# Patient Record
Sex: Male | Born: 1945 | Race: White | Hispanic: No | Marital: Married | Smoking: Never smoker
Health system: Southern US, Community
[De-identification: ages and names within clinical notes are randomized; demographics above are authoritative.]

## PROBLEM LIST (undated history)

## (undated) DIAGNOSIS — E291 Testicular hypofunction: Secondary | ICD-10-CM

## (undated) DIAGNOSIS — N529 Male erectile dysfunction, unspecified: Secondary | ICD-10-CM

## (undated) DIAGNOSIS — N35914 Unspecified anterior urethral stricture, male: Secondary | ICD-10-CM

## (undated) DIAGNOSIS — M47816 Spondylosis without myelopathy or radiculopathy, lumbar region: Secondary | ICD-10-CM

## (undated) DIAGNOSIS — R519 Headache, unspecified: Secondary | ICD-10-CM

## (undated) DIAGNOSIS — Z7901 Long term (current) use of anticoagulants: Secondary | ICD-10-CM

## (undated) DIAGNOSIS — F5104 Psychophysiologic insomnia: Secondary | ICD-10-CM

## (undated) DIAGNOSIS — E559 Vitamin D deficiency, unspecified: Secondary | ICD-10-CM

## (undated) DIAGNOSIS — Z951 Presence of aortocoronary bypass graft: Secondary | ICD-10-CM

## (undated) DIAGNOSIS — K219 Gastro-esophageal reflux disease without esophagitis: Secondary | ICD-10-CM

## (undated) DIAGNOSIS — J309 Allergic rhinitis, unspecified: Secondary | ICD-10-CM

## (undated) DIAGNOSIS — E611 Iron deficiency: Secondary | ICD-10-CM

## (undated) DIAGNOSIS — I341 Nonrheumatic mitral (valve) prolapse: Secondary | ICD-10-CM

## (undated) DIAGNOSIS — M5136 Other intervertebral disc degeneration, lumbar region: Secondary | ICD-10-CM

## (undated) DIAGNOSIS — M159 Polyosteoarthritis, unspecified: Secondary | ICD-10-CM

## (undated) DIAGNOSIS — M1990 Unspecified osteoarthritis, unspecified site: Secondary | ICD-10-CM

## (undated) DIAGNOSIS — I6523 Occlusion and stenosis of bilateral carotid arteries: Secondary | ICD-10-CM

## (undated) DIAGNOSIS — E782 Mixed hyperlipidemia: Secondary | ICD-10-CM

## (undated) DIAGNOSIS — G609 Hereditary and idiopathic neuropathy, unspecified: Secondary | ICD-10-CM

## (undated) DIAGNOSIS — N400 Enlarged prostate without lower urinary tract symptoms: Secondary | ICD-10-CM

## (undated) DIAGNOSIS — M545 Low back pain, unspecified: Secondary | ICD-10-CM

## (undated) DIAGNOSIS — M461 Sacroiliitis, not elsewhere classified: Secondary | ICD-10-CM

## (undated) DIAGNOSIS — I10 Essential (primary) hypertension: Secondary | ICD-10-CM

## (undated) DIAGNOSIS — G453 Amaurosis fugax: Secondary | ICD-10-CM

## (undated) DIAGNOSIS — I2510 Atherosclerotic heart disease of native coronary artery without angina pectoris: Secondary | ICD-10-CM

## (undated) DIAGNOSIS — F419 Anxiety disorder, unspecified: Secondary | ICD-10-CM

## (undated) DIAGNOSIS — Z952 Presence of prosthetic heart valve: Secondary | ICD-10-CM

## (undated) HISTORY — DX: Psychophysiologic insomnia: F51.04

## (undated) HISTORY — DX: Atherosclerotic heart disease of native coronary artery without angina pectoris: I25.10

## (undated) HISTORY — DX: Iron deficiency: E61.1

## (undated) HISTORY — DX: Long term (current) use of anticoagulants: Z79.01

## (undated) HISTORY — DX: Testicular hypofunction: E29.1

## (undated) HISTORY — DX: Mixed hyperlipidemia: E78.2

## (undated) HISTORY — DX: Low back pain, unspecified: M54.50

## (undated) HISTORY — PX: COLONOSCOPY: SHX174

## (undated) HISTORY — PX: CATARACT EXTRACTION: SUR2

## (undated) HISTORY — DX: Anxiety disorder, unspecified: F41.9

## (undated) HISTORY — DX: Unspecified anterior urethral stricture, male: N35.914

## (undated) HISTORY — PX: NASAL SEPTUM SURGERY: SHX37

## (undated) HISTORY — DX: Headache, unspecified: R51.9

## (undated) HISTORY — DX: Unspecified osteoarthritis, unspecified site: M19.90

## (undated) HISTORY — DX: Male erectile dysfunction, unspecified: N52.9

## (undated) HISTORY — DX: Gastro-esophageal reflux disease without esophagitis: K21.9

## (undated) HISTORY — DX: Polyosteoarthritis, unspecified: M15.9

## (undated) HISTORY — DX: Hereditary and idiopathic neuropathy, unspecified: G60.9

## (undated) HISTORY — PX: OTHER SURGICAL HISTORY: SHX169

## (undated) HISTORY — DX: Amaurosis fugax: G45.3

## (undated) HISTORY — DX: Allergic rhinitis, unspecified: J30.9

## (undated) HISTORY — DX: Occlusion and stenosis of bilateral carotid arteries: I65.23

## (undated) HISTORY — DX: Other intervertebral disc degeneration, lumbar region: M51.36

## (undated) HISTORY — DX: Spondylosis without myelopathy or radiculopathy, lumbar region: M47.816

## (undated) HISTORY — PX: BUNIONECTOMY: SHX129

## (undated) HISTORY — PX: CORONARY ARTERY BYPASS GRAFT: SHX141

## (undated) HISTORY — DX: Essential (primary) hypertension: I10

## (undated) HISTORY — DX: Sacroiliitis, not elsewhere classified: M46.1

## (undated) HISTORY — DX: Benign prostatic hyperplasia without lower urinary tract symptoms: N40.0

## (undated) HISTORY — DX: Vitamin D deficiency, unspecified: E55.9

## (undated) HISTORY — PX: HERNIA REPAIR: SHX51

---

## 2019-02-15 ENCOUNTER — Encounter: Payer: Self-pay | Admitting: *Deleted

## 2019-10-16 ENCOUNTER — Other Ambulatory Visit: Payer: Self-pay | Admitting: Dermatology

## 2020-03-07 ENCOUNTER — Emergency Department (HOSPITAL_COMMUNITY): Payer: Medicare Other

## 2020-03-07 ENCOUNTER — Encounter (HOSPITAL_COMMUNITY): Payer: Self-pay

## 2020-03-07 ENCOUNTER — Observation Stay (HOSPITAL_COMMUNITY)
Admission: EM | Admit: 2020-03-07 | Discharge: 2020-03-10 | Disposition: A | Discharge status: Home / Self Care | Payer: Medicare Other | Attending: Family Medicine | Admitting: Family Medicine

## 2020-03-07 DIAGNOSIS — Z952 Presence of prosthetic heart valve: Secondary | ICD-10-CM | POA: Insufficient documentation

## 2020-03-07 DIAGNOSIS — G453 Amaurosis fugax: Secondary | ICD-10-CM | POA: Diagnosis not present

## 2020-03-07 DIAGNOSIS — I2510 Atherosclerotic heart disease of native coronary artery without angina pectoris: Secondary | ICD-10-CM | POA: Insufficient documentation

## 2020-03-07 DIAGNOSIS — Z7982 Long term (current) use of aspirin: Secondary | ICD-10-CM | POA: Insufficient documentation

## 2020-03-07 DIAGNOSIS — R911 Solitary pulmonary nodule: Secondary | ICD-10-CM | POA: Diagnosis not present

## 2020-03-07 DIAGNOSIS — G459 Transient cerebral ischemic attack, unspecified: Secondary | ICD-10-CM | POA: Diagnosis not present

## 2020-03-07 DIAGNOSIS — I10 Essential (primary) hypertension: Secondary | ICD-10-CM | POA: Insufficient documentation

## 2020-03-07 DIAGNOSIS — Z79899 Other long term (current) drug therapy: Secondary | ICD-10-CM | POA: Diagnosis not present

## 2020-03-07 DIAGNOSIS — I6782 Cerebral ischemia: Secondary | ICD-10-CM | POA: Diagnosis not present

## 2020-03-07 DIAGNOSIS — Z20822 Contact with and (suspected) exposure to covid-19: Secondary | ICD-10-CM | POA: Insufficient documentation

## 2020-03-07 DIAGNOSIS — E785 Hyperlipidemia, unspecified: Secondary | ICD-10-CM | POA: Diagnosis not present

## 2020-03-07 DIAGNOSIS — Z8249 Family history of ischemic heart disease and other diseases of the circulatory system: Secondary | ICD-10-CM | POA: Insufficient documentation

## 2020-03-07 DIAGNOSIS — H53132 Sudden visual loss, left eye: Secondary | ICD-10-CM | POA: Diagnosis present

## 2020-03-07 DIAGNOSIS — N400 Enlarged prostate without lower urinary tract symptoms: Secondary | ICD-10-CM | POA: Insufficient documentation

## 2020-03-07 DIAGNOSIS — Z951 Presence of aortocoronary bypass graft: Secondary | ICD-10-CM | POA: Diagnosis not present

## 2020-03-07 DIAGNOSIS — K219 Gastro-esophageal reflux disease without esophagitis: Secondary | ICD-10-CM | POA: Diagnosis not present

## 2020-03-07 DIAGNOSIS — Z7901 Long term (current) use of anticoagulants: Secondary | ICD-10-CM | POA: Diagnosis not present

## 2020-03-07 HISTORY — DX: Nonrheumatic mitral (valve) prolapse: I34.1

## 2020-03-07 HISTORY — DX: Presence of aortocoronary bypass graft: Z95.1

## 2020-03-07 HISTORY — DX: Presence of prosthetic heart valve: Z95.2

## 2020-03-07 LAB — DIFFERENTIAL
Abs Immature Granulocytes: 0.01 10*3/uL (ref 0.00–0.07)
Basophils Absolute: 0 10*3/uL (ref 0.0–0.1)
Basophils Relative: 1 %
Eosinophils Absolute: 0.1 10*3/uL (ref 0.0–0.5)
Eosinophils Relative: 1 %
Immature Granulocytes: 0 %
Lymphocytes Relative: 18 %
Lymphs Abs: 1.3 10*3/uL (ref 0.7–4.0)
Monocytes Absolute: 0.4 10*3/uL (ref 0.1–1.0)
Monocytes Relative: 5 %
Neutro Abs: 5.1 10*3/uL (ref 1.7–7.7)
Neutrophils Relative %: 75 %

## 2020-03-07 LAB — PROTIME-INR
INR: 2.5 — ABNORMAL HIGH (ref 0.8–1.2)
Prothrombin Time: 26.5 seconds — ABNORMAL HIGH (ref 11.4–15.2)

## 2020-03-07 LAB — COMPREHENSIVE METABOLIC PANEL
ALT: 12 U/L (ref 0–44)
AST: 21 U/L (ref 15–41)
Albumin: 4 g/dL (ref 3.5–5.0)
Alkaline Phosphatase: 41 U/L (ref 38–126)
Anion gap: 9 (ref 5–15)
BUN: 11 mg/dL (ref 8–23)
CO2: 25 mmol/L (ref 22–32)
Calcium: 8.9 mg/dL (ref 8.9–10.3)
Chloride: 102 mmol/L (ref 98–111)
Creatinine, Ser: 1.18 mg/dL (ref 0.61–1.24)
GFR calc Af Amer: >60 mL/min (ref >60)
GFR calc non Af Amer: >60 mL/min (ref >60)
Glucose, Bld: 167 mg/dL — ABNORMAL HIGH (ref 70–99)
Potassium: 4.1 mmol/L (ref 3.5–5.1)
Sodium: 136 mmol/L (ref 135–145)
Total Bilirubin: 0.8 mg/dL (ref 0.3–1.2)
Total Protein: 6.3 g/dL — ABNORMAL LOW (ref 6.5–8.1)

## 2020-03-07 LAB — APTT: aPTT: 43 seconds — ABNORMAL HIGH (ref 24–36)

## 2020-03-07 LAB — CBC
HCT: 48 % (ref 39.0–52.0)
Hemoglobin: 15.2 g/dL (ref 13.0–17.0)
MCH: 28.2 pg (ref 26.0–34.0)
MCHC: 31.7 g/dL (ref 30.0–36.0)
MCV: 89.1 fL (ref 80.0–100.0)
Platelets: 176 10*3/uL (ref 150–400)
RBC: 5.39 MIL/uL (ref 4.22–5.81)
RDW: 15 % (ref 11.5–15.5)
WBC: 6.8 10*3/uL (ref 4.0–10.5)
nRBC: 0 % (ref 0.0–0.2)

## 2020-03-07 LAB — I-STAT CHEM 8, ED
BUN: 14 mg/dL (ref 8–23)
Calcium, Ion: 1.16 mmol/L (ref 1.15–1.40)
Chloride: 100 mmol/L (ref 98–111)
Creatinine, Ser: 1.1 mg/dL (ref 0.61–1.24)
Glucose, Bld: 163 mg/dL — ABNORMAL HIGH (ref 70–99)
HCT: 46 % (ref 39.0–52.0)
Hemoglobin: 15.6 g/dL (ref 13.0–17.0)
Potassium: 4.1 mmol/L (ref 3.5–5.1)
Sodium: 139 mmol/L (ref 135–145)
TCO2: 28 mmol/L (ref 22–32)

## 2020-03-07 MED ORDER — SODIUM CHLORIDE 0.9% FLUSH: 3.0000 mL | Freq: Once | INTRAVENOUS | Status: DC

## 2020-03-07 NOTE — ED Triage Notes (Addendum)
Pt arrives to ED w/ c/o intermittent partial vision loss in L eye lasting 1-2 mins that happened this morning at 0700. Vision has since returned to normal. Pt denies unilateral weakness, slurred speech, sensation changes. Pt AOx4, neuro intact in triage. Pt sent from eye MD to r/o stroke.

## 2020-03-08 ENCOUNTER — Emergency Department (HOSPITAL_BASED_OUTPATIENT_CLINIC_OR_DEPARTMENT_OTHER): Payer: Medicare Other

## 2020-03-08 ENCOUNTER — Emergency Department (HOSPITAL_COMMUNITY): Payer: Medicare Other

## 2020-03-08 ENCOUNTER — Encounter (HOSPITAL_COMMUNITY): Payer: Self-pay | Admitting: Emergency Medicine

## 2020-03-08 DIAGNOSIS — G453 Amaurosis fugax: Secondary | ICD-10-CM | POA: Diagnosis not present

## 2020-03-08 DIAGNOSIS — G459 Transient cerebral ischemic attack, unspecified: Secondary | ICD-10-CM

## 2020-03-08 LAB — ECHOCARDIOGRAM COMPLETE
AR max vel: 2.49 cm2
AV Area VTI: 2.42 cm2
AV Area mean vel: 2.38 cm2
AV Mean grad: 3 mmHg
AV Peak grad: 5.9 mmHg
Ao pk vel: 1.21 m/s
Area-P 1/2: 2.62 cm2
P 1/2 time: 560 msec
S' Lateral: 3.1 cm

## 2020-03-08 LAB — HEMOGLOBIN A1C
Hgb A1c MFr Bld: 5.5 % (ref 4.8–5.6)
Mean Plasma Glucose: 111.15 mg/dL

## 2020-03-08 LAB — LIPID PANEL
Cholesterol: 129 mg/dL (ref 0–200)
HDL: 44 mg/dL (ref >40)
LDL Cholesterol: 77 mg/dL (ref 0–99)
Total CHOL/HDL Ratio: 2.9 RATIO
Triglycerides: 42 mg/dL (ref <150)
VLDL: 8 mg/dL (ref 0–40)

## 2020-03-08 LAB — PROTIME-INR
INR: 2.1 — ABNORMAL HIGH (ref 0.8–1.2)
Prothrombin Time: 22.8 seconds — ABNORMAL HIGH (ref 11.4–15.2)

## 2020-03-08 LAB — TSH: TSH: 0.442 u[IU]/mL (ref 0.350–4.500)

## 2020-03-08 LAB — SARS CORONAVIRUS 2 BY RT PCR (HOSPITAL ORDER, PERFORMED IN ~~LOC~~ HOSPITAL LAB): SARS Coronavirus 2: NEGATIVE

## 2020-03-08 LAB — SEDIMENTATION RATE: Sed Rate: 0 mm/hr (ref 0–16)

## 2020-03-08 LAB — C-REACTIVE PROTEIN: CRP: <0.5 mg/dL (ref <1.0)

## 2020-03-08 MED ORDER — ALFUZOSIN HCL ER 10 MG PO TB24
10.0000 mg | ORAL_TABLET | Freq: Every day | ORAL | Status: DC
  Administered 2020-03-08 – 2020-03-09 (×2): 10 mg via ORAL
  Filled 2020-03-08 (×3): qty 1

## 2020-03-08 MED ORDER — AMLODIPINE BESYLATE 5 MG PO TABS: 2.5000 mg | ORAL_TABLET | Freq: Every day | ORAL | Status: DC

## 2020-03-08 MED ORDER — IOHEXOL 350 MG/ML SOLN
80.0000 mL | Freq: Once | INTRAVENOUS | Status: AC | PRN
  Administered 2020-03-08: 80 mL via INTRAVENOUS

## 2020-03-08 MED ORDER — WARFARIN SODIUM 7.5 MG PO TABS
7.5000 mg | ORAL_TABLET | Freq: Once | ORAL | Status: AC
  Administered 2020-03-08: 7.5 mg via ORAL
  Filled 2020-03-08: qty 1

## 2020-03-08 MED ORDER — SIMVASTATIN 20 MG PO TABS
40.0000 mg | ORAL_TABLET | Freq: Every day | ORAL | Status: DC
  Administered 2020-03-08 – 2020-03-09 (×2): 40 mg via ORAL
  Filled 2020-03-08 (×2): qty 2

## 2020-03-08 MED ORDER — AMLODIPINE BESYLATE 2.5 MG PO TABS
2.5000 mg | ORAL_TABLET | Freq: Every day | ORAL | Status: DC
  Administered 2020-03-09: 2.5 mg via ORAL
  Filled 2020-03-08: qty 1

## 2020-03-08 MED ORDER — PANTOPRAZOLE SODIUM 40 MG PO TBEC
40.0000 mg | DELAYED_RELEASE_TABLET | Freq: Every day | ORAL | Status: DC
  Administered 2020-03-08 – 2020-03-10 (×3): 40 mg via ORAL
  Filled 2020-03-08 (×3): qty 1

## 2020-03-08 MED ORDER — ACETAMINOPHEN 325 MG PO TABS
650.0000 mg | ORAL_TABLET | Freq: Four times a day (QID) | ORAL | Status: DC | PRN
  Administered 2020-03-09 – 2020-03-10 (×3): 650 mg via ORAL
  Filled 2020-03-08 (×3): qty 2

## 2020-03-08 MED ORDER — ACETAMINOPHEN 650 MG RE SUPP: 650.0000 mg | Freq: Four times a day (QID) | RECTAL | Status: DC | PRN

## 2020-03-08 MED ORDER — NITROGLYCERIN 0.4 MG SL SUBL: 0.4000 mg | SUBLINGUAL_TABLET | SUBLINGUAL | Status: DC | PRN

## 2020-03-08 MED ORDER — AMLODIPINE BESYLATE 5 MG PO TABS
5.0000 mg | ORAL_TABLET | Freq: Once | ORAL | Status: AC
  Administered 2020-03-08: 5 mg via ORAL
  Filled 2020-03-08: qty 1

## 2020-03-08 MED ORDER — ASPIRIN EC 81 MG PO TBEC
81.0000 mg | DELAYED_RELEASE_TABLET | Freq: Every day | ORAL | Status: DC
  Administered 2020-03-08 – 2020-03-09 (×2): 81 mg via ORAL
  Filled 2020-03-08 (×2): qty 1

## 2020-03-08 MED ORDER — WARFARIN - PHARMACIST DOSING INPATIENT: Freq: Every day | Status: DC

## 2020-03-08 MED ORDER — GABAPENTIN 100 MG PO CAPS
100.0000 mg | ORAL_CAPSULE | Freq: Two times a day (BID) | ORAL | Status: DC
  Administered 2020-03-08 – 2020-03-10 (×5): 100 mg via ORAL
  Filled 2020-03-08 (×5): qty 1

## 2020-03-08 NOTE — ED Notes (Signed)
Echo at bedside

## 2020-03-08 NOTE — ED Notes (Signed)
Patient transported to CT 

## 2020-03-08 NOTE — Progress Notes (Signed)
ANTICOAGULATION CONSULT NOTE - Initial Consult  Pharmacy Consult for warfarin Indication: Mech MVR  No Known Allergies  Patient Measurements:   Pending at this time    Vital Signs: Temp: 98.1 F (36.7 C) (08/13 0618) Temp Source: Oral (08/13 0618) BP: 169/85 (08/13 0826) Pulse Rate: 88 (08/13 0623)  Labs: Recent Labs    03/07/20 1549 03/07/20 1609  HGB 15.2 15.6  HCT 48.0 46.0  PLT 176  --   APTT 43*  --   LABPROT 26.5*  --   INR 2.5*  --   CREATININE 1.18 1.10    CrCl cannot be calculated (Unknown ideal weight.).   Medical History: Past Medical History:  Diagnosis Date  . Arthritis   . Hx of CABG   . Hypertension   . MVP (mitral valve prolapse)   . S/P MVR (mitral valve replacement)    Assessment: 74 yo M on warfarin for hx mechanical mitral valve replacement. INR 2.5 on admission. Admitted with possible TIA with transient vision loss. Of note, had conjunctival hemorrhage in the right eye on 03/05/20 during valsalva (blowing nose). No other active bleeding.    INR down to 2.1, did not give warfarin dose 8/12 while in ED due to pending stroke/TIA work up. Will give higher dose today to prevent further INR drop.  PTA Warfarin dose: 2mg  on Wed/Sat and 4mg  all other days (confirmed with patient 8/13)   Goal of Therapy:  INR 2.5- 3.5 Monitor platelets by anticoagulation protocol: Yes   Plan:  Warfarin 7.5mg  x1  Monitor daily INR, CBC/plt Monitor for signs/symptoms of bleeding    Benetta Spar, PharmD, BCPS, BCCP Clinical Pharmacist  Please check AMION for all [place] phone numbers After 10:00 PM, call [place]

## 2020-03-08 NOTE — ED Provider Notes (Signed)
[facility] EMERGENCY DEPARTMENT Provider Note   CSN: [id number] Arrival date & time: 03/07/20  1518     History Chief Complaint  Patient presents with  . Loss of Vision    Tyler Case is a 74 y.o. male.  HPI 74 yo male with lkn 24 hours began having left eye vision change felt like a shade cming down.  Dr. Posey Pronto saw in office and did study with iv dye.  Dr. Posey Pronto told patient that he had stroke of eye and to come the ED.  Left eye vision resolved after 1-2 minutes and now without symptoms.  Denies speaking, walking, no loss of strength or sensation.  Angiogram done in office-no plaque, ho arterial occlusion in right,  Ho amaurosis right eye.     PMD  Dr. Macarthur Critchley in [place] Dr. Posey Pronto for oph Cardiology- Dr. Lacey Jensen  No  History of stroke Ho cabg and mitral valve replacement Patient on coumadin but missed dose last night due to being here. Inr goal 2.5-3.5- last 2.6 2 weeks ago- no dosage change Covid vaccine- moderna second shot mid May, no sxs no known exposure Past Medical History:  Diagnosis Date  . Arthritis   . Hypertension     There are no problems to display for this patient.   History reviewed. No pertinent surgical history.     No family history on file.  Social History   Tobacco Use  . Smoking status: Never Smoker  . Smokeless tobacco: Never Used  Vaping Use  . Vaping Use: Never used  Substance Use Topics  . Alcohol use: Yes    Alcohol/week: 7.0 standard drinks    Types: 7 Glasses of wine per week  . Drug use: Never    Home Medications Prior to Admission medications   Medication Sig Start Date End Date Taking? Authorizing Provider  esomeprazole (NEXIUM) 40 MG capsule Take 40 mg by mouth daily at 12 noon.    [provider]  imiquimod (ALDARA) 5 % cream APPLY  CREAM EXTERNALLY TO AFFECTED AREA ON MONDAY, [facility] AND FRIDAY FOR 35-70 APPLICATIONS 1/77/93   Lavonna Monarch, MD  simvastatin (ZOCOR) 40 MG  tablet Take 40 mg by mouth daily.    [provider]    Allergies    Patient has no known allergies.  Review of Systems   Review of Systems  All other systems reviewed and are negative.   Physical Exam Updated Vital Signs BP (!) 140/98   Pulse 88   Temp 98.1 F (36.7 C) (Oral)   Resp 19   SpO2 98%   Physical Exam Vitals and nursing note reviewed.  Constitutional:      Appearance: Normal appearance.  HENT:     Head: Normocephalic and atraumatic.     Right Ear: External ear normal.     Left Ear: External ear normal.     Nose: Nose normal.     Mouth/Throat:     Mouth: Mucous membranes are moist.  Eyes:     Extraocular Movements: Extraocular movements intact.     Pupils: Pupils are equal, round, and reactive to light.  Cardiovascular:     Rate and Rhythm: Normal rate and regular rhythm.     Pulses: Normal pulses.     Heart sounds: Normal heart sounds.  Pulmonary:     Effort: Pulmonary effort is normal.  Abdominal:     General: Abdomen is flat.     Palpations: Abdomen is soft.  Musculoskeletal:  General: Normal range of motion.     Cervical back: Normal range of motion.  Skin:    General: Skin is warm.     Capillary Refill: Capillary refill takes less than 2 seconds.  Neurological:     General: No focal deficit present.     Mental Status: He is alert and oriented to person, place, and time. Mental status is at baseline.     Cranial Nerves: No cranial nerve deficit.     Sensory: No sensory deficit.     Motor: No weakness.     Coordination: Coordination normal.     Gait: Gait normal.     Deep Tendon Reflexes: Reflexes normal.  Psychiatric:        Mood and Affect: Mood normal.        Behavior: Behavior normal.     ED Results / Procedures / Treatments   Labs (all labs ordered are listed, but only abnormal results are displayed) Labs Reviewed  PROTIME-INR - Abnormal; Notable for the following components:      Result Value   Prothrombin Time  26.5 (*)    INR 2.5 (*)    All other components within normal limits  APTT - Abnormal; Notable for the following components:   aPTT 43 (*)    All other components within normal limits  COMPREHENSIVE METABOLIC PANEL - Abnormal; Notable for the following components:   Glucose, Bld 167 (*)    Total Protein 6.3 (*)    All other components within normal limits  I-STAT CHEM 8, ED - Abnormal; Notable for the following components:   Glucose, Bld 163 (*)    All other components within normal limits  CBC  DIFFERENTIAL    EKG None  Radiology CT HEAD WO CONTRAST  Result Date: 03/07/2020 CLINICAL DATA:  Vision loss, binocular. EXAM: CT HEAD WITHOUT CONTRAST TECHNIQUE: Contiguous axial images were obtained from the base of the skull through the vertex without intravenous contrast. COMPARISON:  No pertinent prior exams are available for comparison. FINDINGS: Brain: Cerebral volume is normal for age. Minimal ill-defined hypoattenuation within the cerebral white matter is nonspecific, but consistent with chronic small vessel ischemic disease. A tiny age-indeterminate lacunar infarct is also questioned within the posterior right pons (series 3, image 14). There are tiny age-indeterminate infarcts within the cerebellar hemispheres bilaterally (series 3, images 7, 8, 10) There is no acute intracranial hemorrhage. No demarcated cortical infarct. No extra-axial fluid collection. No evidence of intracranial mass. No midline shift. Vascular: No hyperdense vessel Skull: Normal. Negative for fracture or focal lesion. Sinuses/Orbits: Visualized orbits show no acute finding. Ethmoid sinus mucosal thickening with partial opacification of bilateral ethmoid air cells. Mild maxillary sinus mucosal thickening. No significant mastoid effusion. IMPRESSION: Tiny age-indeterminate infarcts within the cerebellar hemispheres bilaterally. A tiny age-indeterminate lacunar infarct is also questioned within the posterior right pons.  Minimal chronic small vessel ischemic changes within the cerebral white matter. Paranasal sinus disease as described, most notably ethmoidal. Correlate for acute sinusitis. Electronically Signed   By: Kellie Simmering DO   On: 03/07/2020 16:56   MR BRAIN WO CONTRAST  Result Date: 03/07/2020 CLINICAL DATA:  Intermittent partial left vision loss EXAM: MRI HEAD WITHOUT CONTRAST TECHNIQUE: Multiplanar, multiecho pulse sequences of the brain and surrounding structures were obtained without intravenous contrast. COMPARISON:  None. FINDINGS: Brain: There is no acute infarction or intracranial hemorrhage. There is no intracranial mass, mass effect, or edema. There is no hydrocephalus or extra-axial fluid collection. Small chronic cerebellar infarcts.  Patchy T2 hyperintensity in the supratentorial and pontine white matter is nonspecific but may reflect mild chronic microvascular ischemic changes. A few scattered punctate foci of susceptibility cerebral white matter are most compatible with chronic microhemorrhages. Vascular: Major vessel flow voids at the skull base are preserved. Skull and upper cervical spine: Normal marrow signal is preserved. Sinuses/Orbits: Minor mucosal thickening.  Orbits are unremarkable. Other: Sella is unremarkable.  Mastoid air cells are clear. IMPRESSION: No evidence of recent infarction, hemorrhage, or mass. Mild chronic microvascular ischemic changes. Few scattered chronic microhemorrhages. Electronically Signed   By: Macy Mis M.D.   On: 03/07/2020 19:55    Procedures Procedures (including critical care time)  Medications Ordered in ED Medications  sodium chloride flush (NS) 0.9 % injection 3 mL (has no administration in time range)    ED Course  I have reviewed the triage vital signs and the nursing notes.  Pertinent labs & imaging results that were available during my care of the patient were reviewed by me and considered in my medical decision making (see chart for  details). Discussed with Dr. Posey Pronto and he did angiogram of left eye in office- no plaque noted. Patient sent to ED due to history of plaque right eye in June.  Discussed with Dr Curly Shores and she will consult and add carotid studies  Discussed with Dr. Tarry Kos, on call for unassigned-FP team.  She will see and evaluate patient.     MDM Rules/Calculators/A&P                           Final Clinical Impression(s) / ED Diagnoses Final diagnoses:  Amaurosis fugax of left eye    Rx / DC Orders ED Discharge Orders    None       Pattricia Boss, MD 03/08/20 [phone]

## 2020-03-08 NOTE — ED Notes (Signed)
The pt and wife are concerned about the dose of coumadin is too high  Ed pharmacist contacted and he will clerify if we give this dose or not

## 2020-03-08 NOTE — Hospital Course (Addendum)
Tyler Case is a 74 y.o. male presenting with transient monocular vision loss of the left eye. 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 Below is his brief hospital course listed by problem list. Refer to H&P for additional information.  Amaurosis-fugax of Left eye Presented after transient painless monocular vision loss of left eye night piror. Seen by ophthalmologist prior to arrival at ED, had negative angiogram. Instructed to come to the Emergency Department for further work up. CTA head and neck negative in ED. ESA/CRP negative. CBC and CMP within normal limits. Neurology consulted. HbgA1c 5.5, lipid panel WNL, TSH 0.442, RPR non-reactive, HIV non-reactive, Echo unremarkable. Continued on home ASA and simvastatin. INR dosed by pharmacy, goal 3-3.5 for mitral valve replacement and with his TIA at INR 2.5. Subtherapeutic on admission, required Lovenox bridge. INR *** on discharge.  Other medical conditions chronic and stable.   Imaging  CT Angio Head and Neck 8/13 IMPRESSION: No large vessel occlusion or hemodynamically significant stenosis. 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 2 mm right upper lobe nodule. No follow-up needed if patient is low-risk. Non-contrast chest CT can be considered in 12 months if patient is high-risk. This recommendation follows the consensus statement: Guidelines for Management of Incidental Pulmonary Nodules Detected on CT Images: From the Fleischner Society 2017; Radiology 2017; 284:228-243.  CT Head without Contrast 8/12 IMPRESSION: Tiny age-indeterminate infarcts within the cerebellar hemispheres bilaterally.A tiny age-indeterminate lacunar infarct is also questioned within the posterior right pons.Minimal chronic small vessel ischemic changes within  the cerebral white matter. Paranasal sinus disease as described, most notably ethmoidal.Correlate for acute sinusitis.  MR Brain Without Contrast 8/12 IMPRESSION: No evidence of recent infarction, hemorrhage, or mass. Mild chronic microvascular ischemic changes. Few scattered chronic microhemorrhages.  Follow up issues: Follow up with Neurologist outpatient

## 2020-03-08 NOTE — Progress Notes (Signed)
  Echocardiogram 2D Echocardiogram has been performed.  Tyler Case 03/08/2020, 2:37 PM

## 2020-03-08 NOTE — Consult Note (Addendum)
Neurology Consultation Reason for Consult: amneurosis fugax Referring Physician: Dr. Jeanell Sparrow  CC: Transient left eye superior vision loss  History is obtained from: Patient  HPI: Tyler Case is a 74 y.o. male presented to the hospital secondary to having sudden onset of superior vision out of the left eye which lasted for about 1.5 minutes and resolved.  Patient sees Dr. Posey Pronto ophthalmology to whom he did call and explain the symptoms.  At that point Dr. Posey Pronto felt as though he should go to the ED to be observed as this could be an ameurosis fugax.  Currently patient has no symptoms.  He denies any headache, diplopia however he has been having some discomfort in the posterior aspect of his neck.  Patient denies doing any illicit drugs or smoking.  Patient does have a history of CABG and mitral valve replacement thus he is on Coumadin however he states he did miss a dose last night due to being in the hospital.  His INR goal is 2.5-3.5.  His last INR was 2.6 two weeks ago.  With regards to potential symptoms of giant cell arteritis, he denies any proximal muscle weakness including denying difficulty going up the stairs or getting things off of shelves.  He denies jaw claudication, new fatigue, temporal tenderness, headaches, changes in his baseline arthritis, fevers, chills, sweats, muscle aches.  LKW: 7:15 AM on 03/07/2020 tPA given?: No, no due to the fact that symptoms resolved IA performed?: No, symptoms resolved and not large vessel occlusion Premorbid modified rankin scale: 0 NIH stroke score:0  ROS: A 14 point ROS was performed and is negative except as noted in the HPI.     Past Medical History:  Diagnosis Date  . Arthritis   . Hx of CABG   . Hypertension   . MVP (mitral valve prolapse)   . S/P MVR (mitral valve replacement)    Family History  Problem Relation Age of Onset  . Hypertension Mother   . Hypertension Father    Social History:  reports that he has never smoked. He  has never used smokeless tobacco. He reports current alcohol use of about 7.0 standard drinks of alcohol per week. He reports that he does not use drugs.   Exam: Current vital signs: BP (!) 169/85 (BP Location: Left Arm)   Pulse 88   Temp 98.1 F (36.7 C) (Oral)   Resp 19   SpO2 98%  Vital signs in last 24 hours: Temp:  [97.5 F (36.4 C)-98.3 F (36.8 C)] 98.1 F (36.7 C) (08/13 0618) Pulse Rate:  [56-88] 88 (08/13 0623) Resp:  [16-19] 19 (08/13 0623) BP: (135-175)/(64-98) 169/85 (08/13 0826) SpO2:  [95 %-99 %] 98 % (08/13 0826)   Physical Exam  Constitutional: Appears well-developed and well-nourished.  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  Cardiovascular: Palpable.  Respiratory: Effort normal, non-labored breathing GI: Soft.  No distension. There is no tenderness.  Skin: WDI  Neuro: Mental Status: Patient is awake, alert, oriented to person, place, month, year, and situation. Patient is able to give a clear and coherent history. No signs of aphasia or neglect Cranial Nerves: II: Visual Fields are full. Pupils are equal, round, and reactive to light.   III,IV, VI: EOMI without ptosis or diploplia.  V: Facial sensation is symmetric to temperature VII: Facial movement is symmetric.  VIII: hearing is intact to voice X: Uvula elevates symmetrically  XI: Shoulder shrug is symmetric. XII: tongue is midline without atrophy or fasciculations.  Motor: Tone is normal. Bulk is normal. 5/5 strength was present in all four extremities.  Sensory: Sensation is symmetric to light touch and temperature in the arms and legs. Deep Tendon Reflexes: 2+ and symmetric in the biceps and patellae.  Plantars: Toes are downgoing bilaterally.  Cerebellar: FNF and HKS are intact bilaterally  I have reviewed labs in epic and the results pertinent to this consultation  are: LDL-77 A1c-5.5 TSH-0.442 ESR 0 CRP less than 0.5  I have reviewed the images obtained:  Dry head HCT personally reviewed, agree with radiology read Tiny age-indeterminate infarcts within the cerebellar hemispheres bilaterally. A tiny age-indeterminate lacunar infarct is also questioned within the posterior right pons. Minimal chronic small vessel ischemic changes within the cerebral white matter.  CTA head and neck personally reviewed No culprit stenosis to explain symptoms   MRI brain personally reviewed No acute process Scattered microhemorhages do not meet criteria for CAA, though one is cortical and not in a typical location for hypertension  Impression: This is a 74 year old gentleman who presents with an episode of amaurosis fugax, with notable risk factors of mechanical mitral valve on anticoagulation, coronary artery disease status post CABG, and hypertension.  While at his age giant cell arteritis would be on the differential, given his negative ESR and absence of any symptoms suggestive of GCA or polymyalgia rheumatica, any benefit of steroids is not outweighed by the risks.  However he does merit TIA work-up, especially given his cardiac history.  Recommendations: - Admit for TIA workup  - CTA head and neck completed and personally reviewed as above - HgbA1c, fasting lipid panel, ESR, CRP, TSH, RPR, HIV  - MRI of the brain without contrast already completed and personally reviewed as above - Frequent neuro checks - Echocardiogram - Continue home ASA and simvastatin - Risk factor modification - Telemetry monitoring - PT consult, OT consult, Speech consult - Stroke team to follow  Currently on Coumadin  - Continue home warfarin  # Hypertension - goal normotension, continue home medications  Lesleigh Noe MD-PhD [facility] [phone]  Addended for charge capture

## 2020-03-08 NOTE — ED Notes (Signed)
Introduced self to pt and pts wife. Pt states he had 1-3 min of the loss of vision to the left eye - upper outter 1/4 of eye. This has resolved now. Denies HA. Denies n/v.

## 2020-03-08 NOTE — ED Notes (Signed)
Pt moved to a recliner for comfort

## 2020-03-08 NOTE — H&P (Addendum)
[facility] Admission History and Physical Service Pager: [phone]  Patient name: Tyler Case Medical record number: [id number] Date of birth: 04-22-1946 Age: 74 y.o. Gender: male  Primary Care Provider: Earney Mallet, MD Consultants: Neuro Code Status: Full   Chief Complaint: sudden painless vision loss of left eye  Assessment and Plan: Tyler Case is a 74 y.o. male presenting with transient monocular vision loss of the left eye. 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   Amaurosis-fugax of Left eye: Patient presenting with transient painless monocular vision loss of the left eye, described as a shade coming down, occurred yesterday, lasted several minutes, and has since resolved. His LKN was 4pm on 8/12. He was urgently evaluated by his ophthalmologist, Dr. Posey Pronto, who found no underlying cause including negative angiogram of left eye for stenosis/plaque. He was instructed to present to ED for further stroke work up. He had one prior history of this occurring in the right eye in June 2021 and was found to have a small plaque in retinal artery on angiogram which self resolved. Likely etiology of transient painless vision loss is TIA given his significant CV risk factors including CAD s/p 2-vessel bypass, mechanical mitral valve on warfarin, HTN, HLD, vascular etiology is highest on the differential. He has had extensive work up with MRI negative for acute process but notable for chronic infarcts/ischemic changes. 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 EKG with NSR. ESA/CRP negative thus unlikely giant cell arteritis. All remaining labs including CBC and CMP WNL. Unlikely seizure, optic causes, papilledema Neurology consulted who recommends continued TIA work  up.  - admit to [facility], med-tele, attending Dr. Andria Frames - Neuro consulted, appreciate recs - Risk start labs: HgbA1C, lipid panel, TSH, RPR, HIV - frqeuent neuro checks - echocardiogram - telemetry - continue home ASA, warfarin, simvastatin - PT/OT/SLP - BP goal: normotensive - AM EKG - AM labs  Mechanical Mitral Valve  CAD s/p 2-vessel Bypass: Home meds: Warfarin and ASA. INR on admission 2.5. INR goal 2.5-3.5. - warfarin per pharmacy - continue ASA  HTN: BP 169/85 on admission.  Home meds: Amlodipine 2.5mg  QD - continue home meds - consider increasing if indicated - BP goal: <140/90  HLD: No records in chart. Home meds: Simvastatin 40mg  QD. - follow up lipid panel - continue Simvastatin 40mg  QD  GERD: - continue home Protonix 40mg  QD  BPH: Chronic. Followed by urologist in [place]. Home meds: Alfuzosin 10mg  qHS - continue home meds  Peripheral Neuropathy: Home meds: Gabapentin 100mg  BID - continue home meds  Right Upper Lobe Pulmonary Nodule: CTA head/neck notable for a 11mm right upper lobe nodule. Never smoker thus low risk.  - No follow up indicated if low risk. Defer further work up to PCP  FEN/GI: Heart healthy after swallow study Prophylaxis: Warfarin  Disposition: pending work up  History of Present Illness:  Tyler Case is a 74 y.o. male presenting after experiencing sudden onset painless vision loss of the top 1/3rd of the left eye.   He notes he sees Dr. Posey Pronto regularly for ophthalmology. 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 He was warned of this and given red flags to look out for.   He notes that yesterday he all of a sudden  has a black cloud come down like a shade over his left eye. After few a minutes it cleared up by it self. He was seen by ophthalmologist Dr. Posey Pronto who performed an angiogram that was negative for stenosis.  He notes that he had a similar occurrence several  weeks ago in his right eye. It lasted a few seconds then resolved. He was evaluated by Dr. Posey Pronto who performed an angiogram of the right eye at that time which was noted to have a plaque. This resolved spontaneously.   He notes that he has a history of migraines with aura that includes "sparkly" floaters in his eyes. This happens in both eyes. Last migraine was a couple of weeks ago. This was different.   Denies any other symptoms when the vision changes occurred such as chest pain, neck pain, SOB, lightheadedness/dizsiness.  He does endorse conjunctivitis in his right eye x 3 days ago. He has a history of conjunctival hemorrhage of his right eye that often happens when he has sudden valsalva like when blowing his nose. He notes that his left eye became a little irritated during his ED visit today when something got into it and he had to rub it some. Denies feeling like any foreign body is there now. Denies any pain, discharge, purities.   Endorses occasional palpitation, occurs about 1x/wek. Notes that feels like a "powerful beat". Denies any history of arrhythmia.   Tobacco use: never Alcohol: 2-3oz of red wine a night Illicit drugs: none  Review Of Systems: Per HPI with the following additions:   Review of Systems  Constitutional: Negative for chills and fever.  HENT: Negative for congestion and sore throat.   Eyes: Positive for redness. Negative for blurred vision, double vision, photophobia, pain and discharge.       Transient painless vision loss in left eye  Respiratory: Negative for cough and shortness of breath.   Cardiovascular: Positive for palpitations. Negative for chest pain, orthopnea, claudication, leg swelling and PND.  Gastrointestinal: Negative for abdominal pain, constipation, diarrhea, nausea and vomiting.  Neurological: Negative for dizziness, tremors, focal weakness, seizures, loss of consciousness, weakness and headaches.  There are no problems to display for this  patient.   Past Medical History: Past Medical History:  Diagnosis Date  . Arthritis   . Hypertension     Past Surgical History: History reviewed. No pertinent surgical history.  Social History: Social History   Tobacco Use  . Smoking status: Never Smoker  . Smokeless tobacco: Never Used  Vaping Use  . Vaping Use: Never used  Substance Use Topics  . Alcohol use: Yes    Alcohol/week: 7.0 standard drinks    Types: 7 Glasses of wine per week  . Drug use: Never   Additional social history:  Please also refer to relevant sections of EMR.  Family History: Family History  Problem Relation Age of Onset  . Hypertension Mother   . Hypertension Father     Allergies and Medications: No Known Allergies No current facility-administered medications on file prior to encounter.   Current Outpatient Medications on File Prior to Encounter  Medication Sig Dispense Refill  . alfuzosin (UROXATRAL) 10 MG 24 hr tablet Take 10 mg by mouth at bedtime.     Marland Kitchen amLODipine (NORVASC) 2.5 MG tablet Take 2.5 mg by mouth at bedtime.     Marland Kitchen aspirin EC 81 MG tablet Take 81 mg by mouth at bedtime.     . Cholecalciferol 50 MCG (2000 UT) CAPS  Take 2,000 Units by mouth daily.    Marland Kitchen gabapentin (NEURONTIN) 100 MG capsule Take 100 mg by mouth 2 (two) times daily.     Marland Kitchen GLUCOSAMINE-CHONDROITIN PO Take 1 tablet by mouth daily. Glucosamine-chondroitin 1500-1200    . Multiple Vitamin (MULTIVITAMIN) tablet Take 1 tablet by mouth daily.    . nitroGLYCERIN (NITROSTAT) 0.4 MG SL tablet Place 0.4 mg under the tongue every 5 (five) minutes x 3 doses as needed for chest pain.    . pantoprazole (PROTONIX) 40 MG tablet Take 40 mg by mouth daily.    . simvastatin (ZOCOR) 40 MG tablet Take 40 mg by mouth at bedtime.     . triamcinolone (NASACORT ALLERGY 24HR) 55 MCG/ACT AERO nasal inhaler Place 2 sprays into the nose daily as needed (allergies).    . warfarin (COUMADIN) 4 MG tablet Take 4 mg by mouth at bedtime.     Marland Kitchen  zolpidem (AMBIEN) 10 MG tablet Take 10 mg by mouth at bedtime as needed for sleep.    Marland Kitchen imiquimod (ALDARA) 5 % cream APPLY  CREAM EXTERNALLY TO AFFECTED AREA ON MONDAY, WEDNESDAY AND FRIDAY FOR 46-50 APPLICATIONS (Patient not taking: Reported on 03/08/2020) 6 each 0    Objective: BP (!) 169/85 (BP Location: Left Arm)   Pulse 88   Temp 98.1 F (36.7 C) (Oral)   Resp 19   SpO2 98%  Exam: General: pleasant older male, lying comfortably in exam chair, well nourished, well developed, in no acute distress with non-toxic appearance HEENT: normocephalic, atraumatic, moist mucous membranes, oropharynx without erythema, hyperplasia or exudate, uvula midline  EYE: normal vessels, no hemorrhage, normal macula without signs of papilledema, conjunctivitis R>L, no foreign body identified, no pain to palpation or EOMI CV: regular rate and rhythm without murmurs, rubs, or gallops, no lower extremity edema, 2+ radial, pedal, and posterior tibial pulses bilaterally Lungs: clear to auscultation bilaterally with normal work of breathing Abdomen: soft, non-tender, non-distended, normoactive bowel sounds Skin: warm, dry  Extremities: warm and well perfused, normal tone Neuro: Alert and oriented, speech normal, EOMI, PERRLA, CNII-XII intact bilaterally, 5/5 strength bilaterally UE and LE   Labs and Imaging: CBC BMET  Recent Labs  Lab 03/07/20 1549 03/07/20 1549 03/07/20 1609  WBC 6.8  --   --   HGB 15.2   < > 15.6  HCT 48.0   < > 46.0  PLT 176  --   --    < > = values in this interval not displayed.   Recent Labs  Lab 03/07/20 1549 03/07/20 1549 03/07/20 1609  NA 136   < > 139  K 4.1   < > 4.1  CL 102   < > 100  CO2 25  --   --   BUN 11   < > 14  CREATININE 1.18   < > 1.10  GLUCOSE 167*   < > 163*  CALCIUM 8.9  --   --    < > = values in this interval not displayed.     CT Code Stroke CTA Head W/WO contrast  Result Date: 03/08/2020 CLINICAL DATA:  Intermittent partial left vision loss  EXAM: CT ANGIOGRAPHY HEAD AND NECK TECHNIQUE: Multidetector CT imaging of the head and neck was performed using the standard protocol during bolus administration of intravenous contrast. Multiplanar CT image reconstructions and MIPs were obtained to evaluate the vascular anatomy. Carotid stenosis measurements (when applicable) are obtained utilizing NASCET criteria, using the distal internal carotid diameter as the denominator.  CONTRAST:  52mL OMNIPAQUE IOHEXOL 350 MG/ML SOLN COMPARISON:  None. FINDINGS: CTA NECK Aortic arch: Great vessel origins are patent. Right carotid system: Patent. There is no measurable stenosis at the ICA origin. There is a broad-based medially directed outpouching from the cervical ICA at the C2 level measuring approximately 6 x 5 mm on series 8, image 112. Left carotid system: Patent. There is no measurable stenosis at the ICA origin. Vertebral arteries: Patent and codominant. Skeleton: Degenerative changes of the cervical spine. Other neck: No mass or adenopathy. Upper chest: There is a 2 mm nodule of the right upper lobe on series 5, image 166. Review of the MIP images confirms the above findings CTA HEAD Anterior circulation: Intracranial internal carotid arteries patent with trace calcified plaque. Anterior and middle cerebral arteries are patent. Posterior circulation: Intracranial vertebral arteries, basilar artery and posterior cerebral arteries patent. There are patent bilateral posterior communicating arteries. Venous sinuses: Patent as allowed by contrast bolus timing. Review of the MIP images confirms the above findings IMPRESSION: No large vessel occlusion or hemodynamically significant stenosis. 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 2 mm right upper lobe nodule. No follow-up needed if patient is low-risk. Non-contrast chest CT can be considered in 12 months if patient is high-risk. This recommendation  follows the consensus statement: Guidelines for Management of Incidental Pulmonary Nodules Detected on CT Images: From the Fleischner Society 2017; Radiology 2017; 284:228-243. Electronically Signed   By: Macy Mis M.D.   On: 03/08/2020 10:22   CT HEAD WO CONTRAST  Result Date: 03/07/2020 CLINICAL DATA:  Vision loss, binocular. EXAM: CT HEAD WITHOUT CONTRAST TECHNIQUE: Contiguous axial images were obtained from the base of the skull through the vertex without intravenous contrast. COMPARISON:  No pertinent prior exams are available for comparison. FINDINGS: Brain: Cerebral volume is normal for age. Minimal ill-defined hypoattenuation within the cerebral white matter is nonspecific, but consistent with chronic small vessel ischemic disease. A tiny age-indeterminate lacunar infarct is also questioned within the posterior right pons (series 3, image 14). There are tiny age-indeterminate infarcts within the cerebellar hemispheres bilaterally (series 3, images 7, 8, 10) There is no acute intracranial hemorrhage. No demarcated cortical infarct. No extra-axial fluid collection. No evidence of intracranial mass. No midline shift. Vascular: No hyperdense vessel Skull: Normal. Negative for fracture or focal lesion. Sinuses/Orbits: Visualized orbits show no acute finding. Ethmoid sinus mucosal thickening with partial opacification of bilateral ethmoid air cells. Mild maxillary sinus mucosal thickening. No significant mastoid effusion. IMPRESSION: Tiny age-indeterminate infarcts within the cerebellar hemispheres bilaterally. A tiny age-indeterminate lacunar infarct is also questioned within the posterior right pons. Minimal chronic small vessel ischemic changes within the cerebral white matter. Paranasal sinus disease as described, most notably ethmoidal. Correlate for acute sinusitis. Electronically Signed   By: Kellie Simmering DO   On: 03/07/2020 16:56   CT Code Stroke CTA Neck W/WO contrast  Result Date:  03/08/2020 CLINICAL DATA:  Intermittent partial left vision loss EXAM: CT ANGIOGRAPHY HEAD AND NECK TECHNIQUE: Multidetector CT imaging of the head and neck was performed using the standard protocol during bolus administration of intravenous contrast. Multiplanar CT image reconstructions and MIPs were obtained to evaluate the vascular anatomy. Carotid stenosis measurements (when applicable) are obtained utilizing NASCET criteria, using the distal internal carotid diameter as the denominator. CONTRAST:  49mL OMNIPAQUE IOHEXOL 350 MG/ML SOLN COMPARISON:  None. FINDINGS: CTA NECK Aortic arch: Great vessel origins are patent. Right carotid  system: Patent. There is no measurable stenosis at the ICA origin. There is a broad-based medially directed outpouching from the cervical ICA at the C2 level measuring approximately 6 x 5 mm on series 8, image 112. Left carotid system: Patent. There is no measurable stenosis at the ICA origin. Vertebral arteries: Patent and codominant. Skeleton: Degenerative changes of the cervical spine. Other neck: No mass or adenopathy. Upper chest: There is a 2 mm nodule of the right upper lobe on series 5, image 166. Review of the MIP images confirms the above findings CTA HEAD Anterior circulation: Intracranial internal carotid arteries patent with trace calcified plaque. Anterior and middle cerebral arteries are patent. Posterior circulation: Intracranial vertebral arteries, basilar artery and posterior cerebral arteries patent. There are patent bilateral posterior communicating arteries. Venous sinuses: Patent as allowed by contrast bolus timing. Review of the MIP images confirms the above findings IMPRESSION: No large vessel occlusion or hemodynamically significant stenosis. 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 2 mm right upper lobe nodule. No follow-up needed if patient is low-risk. Non-contrast chest CT can be  considered in 12 months if patient is high-risk. This recommendation follows the consensus statement: Guidelines for Management of Incidental Pulmonary Nodules Detected on CT Images: From the Fleischner Society 2017; Radiology 2017; 284:228-243. Electronically Signed   By: Macy Mis M.D.   On: 03/08/2020 10:22   MR BRAIN WO CONTRAST  Result Date: 03/07/2020 CLINICAL DATA:  Intermittent partial left vision loss EXAM: MRI HEAD WITHOUT CONTRAST TECHNIQUE: Multiplanar, multiecho pulse sequences of the brain and surrounding structures were obtained without intravenous contrast. COMPARISON:  None. FINDINGS: Brain: There is no acute infarction or intracranial hemorrhage. There is no intracranial mass, mass effect, or edema. There is no hydrocephalus or extra-axial fluid collection. Small chronic cerebellar infarcts. Patchy T2 hyperintensity in the supratentorial and pontine white matter is nonspecific but may reflect mild chronic microvascular ischemic changes. A few scattered punctate foci of susceptibility cerebral white matter are most compatible with chronic microhemorrhages. Vascular: Major vessel flow voids at the skull base are preserved. Skull and upper cervical spine: Normal marrow signal is preserved. Sinuses/Orbits: Minor mucosal thickening.  Orbits are unremarkable. Other: Sella is unremarkable.  Mastoid air cells are clear. IMPRESSION: No evidence of recent infarction, hemorrhage, or mass. Mild chronic microvascular ischemic changes. Few scattered chronic microhemorrhages. Electronically Signed   By: Macy Mis M.D.   On: 03/07/2020 19:55     Danna Hefty, DO 03/08/2020, 12:21 PM PGY-3, [facility] pager: [phone], text pages welcome

## 2020-03-08 NOTE — ED Notes (Signed)
Pt returned from CT °

## 2020-03-08 NOTE — ED Notes (Signed)
Redness rt eye from broken blood vessel according to the pt

## 2020-03-08 NOTE — Consult Note (Addendum)
Neurology Consultation Reason for Consult: amneurosis fugax Referring Physician: Dr. Jeanell Sparrow  CC: Transient left eye superior vision loss  History is obtained from: Patient  HPI: Tyler Case is a 74 y.o. male presented to the hospital secondary to having sudden onset of superior vision out of the left eye which lasted for about 1.5 minutes and resolved.  Patient sees Dr. Posey Pronto ophthalmology to whom he did call and explain the symptoms.  At that point Dr. Posey Pronto felt as though he should go to the ED to be observed as this could be an ameurosis fugax.  Currently patient has no symptoms.  He denies any headache, diplopia however he has been having some discomfort in the posterior aspect of his neck.  Patient denies doing any illicit drugs or smoking.  Patient does have a history of CABG and mitral valve replacement thus he is on Coumadin however he states he did miss a dose last night due to being in the hospital.  His INR goal is 2.5-3.5.  His last INR was 2.6 two weeks ago.  With regards to potential symptoms of giant cell arteritis, he denies any proximal muscle weakness including denying difficulty going up the stairs or getting things off of shelves.  He denies jaw claudication, new fatigue, temporal tenderness, headaches, changes in his baseline arthritis, fevers, chills, sweats, muscle aches.  LKW: 7:15 AM on 03/07/2020 tPA given?: No, no due to the fact that symptoms resolved IA performed?: No, symptoms resolved and not large vessel occlusion Premorbid modified rankin scale: 0 NIH stroke score:0  ROS: A 14 point ROS was performed and is negative except as noted in the HPI.     Past Medical History:  Diagnosis Date  . Arthritis   . Hx of CABG   . Hypertension   . MVP (mitral valve prolapse)   . S/P MVR (mitral valve replacement)    Family History  Problem Relation Age of Onset  . Hypertension Mother   . Hypertension Father    Social History:  reports that he has never smoked. He  has never used smokeless tobacco. He reports current alcohol use of about 7.0 standard drinks of alcohol per week. He reports that he does not use drugs.   Exam: Current vital signs: BP (!) 169/85 (BP Location: Left Arm)   Pulse 88   Temp 98.1 F (36.7 C) (Oral)   Resp 19   SpO2 98%  Vital signs in last 24 hours: Temp:  [97.5 F (36.4 C)-98.3 F (36.8 C)] 98.1 F (36.7 C) (08/13 0618) Pulse Rate:  [56-88] 88 (08/13 0623) Resp:  [16-19] 19 (08/13 0623) BP: (135-175)/(64-98) 169/85 (08/13 0826) SpO2:  [95 %-99 %] 98 % (08/13 0826)   Physical Exam  Constitutional: Appears well-developed and well-nourished.  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  Cardiovascular: Palpable.  Respiratory: Effort normal, non-labored breathing GI: Soft.  No distension. There is no tenderness.  Skin: WDI  Neuro: Mental Status: Patient is awake, alert, oriented to person, place, month, year, and situation. Patient is able to give a clear and coherent history. No signs of aphasia or neglect Cranial Nerves: II: Visual Fields are full. Pupils are equal, round, and reactive to light.   III,IV, VI: EOMI without ptosis or diploplia.  V: Facial sensation is symmetric to temperature VII: Facial movement is symmetric.  VIII: hearing is intact to voice X: Uvula elevates symmetrically  XI: Shoulder shrug is symmetric. XII: tongue is midline without atrophy or fasciculations.  Motor: Tone is normal. Bulk is normal. 5/5 strength was present in all four extremities.  Sensory: Sensation is symmetric to light touch and temperature in the arms and legs. Deep Tendon Reflexes: 2+ and symmetric in the biceps and patellae.  Plantars: Toes are downgoing bilaterally.  Cerebellar: FNF and HKS are intact bilaterally  I have reviewed labs in epic and the results pertinent to this consultation are:  LDL-77 A1c-5.5 TSH-0.442 ESR 0 CRP less than 0.5  I have reviewed the images obtained:  Dry head HCT personally reviewed, agree with radiology read Tiny age-indeterminate infarcts within the cerebellar hemispheres bilaterally. A tiny age-indeterminate lacunar infarct is also questioned within the posterior right pons. Minimal chronic small vessel ischemic changes within the cerebral white matter.  CTA head and neck personally reviewed No culprit stenosis to explain symptoms   MRI brain personally reviewed No acute process Scattered microhemorhages do not meet criteria for CAA, though one is cortical and not in a typical location for hypertension  Impression: This is a 74 year old gentleman who presents with an episode of amaurosis fugax, with notable risk factors of mechanical mitral valve on anticoagulation, coronary artery disease status post CABG, and hypertension.  While at his age giant cell arteritis would be on the differential, given his negative ESR and absence of any symptoms suggestive of GCA or polymyalgia rheumatica, any benefit of steroids is not outweighed by the risks.  However he does merit TIA work-up, especially given his cardiac history.  Recommendations: - Admit for TIA workup  - CTA head and neck completed and personally reviewed as above - HgbA1c, fasting lipid panel, ESR, CRP, TSH, RPR, HIV  - MRI of the brain without contrast already completed and personally reviewed as above - Frequent neuro checks - Echocardiogram - Continue home ASA and simvastatin - Risk factor modification - Telemetry monitoring - PT consult, OT consult, Speech consult - Stroke team to follow  Currently on Coumadin  - Continue home warfarin  # Hypertension - goal normotension, continue home medications  Lesleigh Noe MD-PhD [facility] [phone]  Addended for charge capture

## 2020-03-08 NOTE — ED Notes (Signed)
Report given to rn on 3  w 

## 2020-03-08 NOTE — ED Notes (Signed)
[facility] locked by another user.

## 2020-03-09 ENCOUNTER — Other Ambulatory Visit: Payer: Self-pay

## 2020-03-09 DIAGNOSIS — G459 Transient cerebral ischemic attack, unspecified: Secondary | ICD-10-CM

## 2020-03-09 DIAGNOSIS — Z952 Presence of prosthetic heart valve: Secondary | ICD-10-CM

## 2020-03-09 DIAGNOSIS — G453 Amaurosis fugax: Secondary | ICD-10-CM | POA: Diagnosis not present

## 2020-03-09 DIAGNOSIS — Z7901 Long term (current) use of anticoagulants: Secondary | ICD-10-CM

## 2020-03-09 LAB — PROTIME-INR
INR: 2.1 — ABNORMAL HIGH (ref 0.8–1.2)
Prothrombin Time: 22.6 seconds — ABNORMAL HIGH (ref 11.4–15.2)

## 2020-03-09 LAB — CBC
HCT: 46.9 % (ref 39.0–52.0)
Hemoglobin: 15.5 g/dL (ref 13.0–17.0)
MCH: 28.8 pg (ref 26.0–34.0)
MCHC: 33 g/dL (ref 30.0–36.0)
MCV: 87.2 fL (ref 80.0–100.0)
Platelets: 193 10*3/uL (ref 150–400)
RBC: 5.38 MIL/uL (ref 4.22–5.81)
RDW: 15.2 % (ref 11.5–15.5)
WBC: 8.3 10*3/uL (ref 4.0–10.5)
nRBC: 0 % (ref 0.0–0.2)

## 2020-03-09 LAB — BASIC METABOLIC PANEL
Anion gap: 7 (ref 5–15)
BUN: 12 mg/dL (ref 8–23)
CO2: 27 mmol/L (ref 22–32)
Calcium: 8.6 mg/dL — ABNORMAL LOW (ref 8.9–10.3)
Chloride: 103 mmol/L (ref 98–111)
Creatinine, Ser: 1.08 mg/dL (ref 0.61–1.24)
GFR calc Af Amer: >60 mL/min (ref >60)
GFR calc non Af Amer: >60 mL/min (ref >60)
Glucose, Bld: 105 mg/dL — ABNORMAL HIGH (ref 70–99)
Potassium: 3.8 mmol/L (ref 3.5–5.1)
Sodium: 137 mmol/L (ref 135–145)

## 2020-03-09 LAB — RPR: RPR Ser Ql: NONREACTIVE

## 2020-03-09 LAB — GLUCOSE, CAPILLARY: Glucose-Capillary: 123 mg/dL — ABNORMAL HIGH (ref 70–99)

## 2020-03-09 MED ORDER — WARFARIN SODIUM 7.5 MG PO TABS
7.5000 mg | ORAL_TABLET | Freq: Once | ORAL | Status: AC
  Administered 2020-03-09: 7.5 mg via ORAL
  Filled 2020-03-09: qty 1

## 2020-03-09 MED ORDER — ENOXAPARIN SODIUM 80 MG/0.8ML ~~LOC~~ SOLN
70.0000 mg | Freq: Two times a day (BID) | SUBCUTANEOUS | Status: DC
  Administered 2020-03-09 (×2): 70 mg via SUBCUTANEOUS
  Filled 2020-03-09 (×2): qty 0.8

## 2020-03-09 MED ORDER — MELATONIN 3 MG PO TABS
3.0000 mg | ORAL_TABLET | Freq: Every day | ORAL | Status: DC
  Administered 2020-03-10: 3 mg via ORAL
  Filled 2020-03-09: qty 1

## 2020-03-09 NOTE — Evaluation (Signed)
Clinical/Bedside Swallow Evaluation Patient Details  Name: Tyler Case MRN: [id number] Date of Birth: 24-Apr-1946  Today's Date: 03/09/2020 Time: SLP Start Time (ACUTE ONLY): 1146 SLP Stop Time (ACUTE ONLY): 1158 SLP Time Calculation (min) (ACUTE ONLY): 12 min  Past Medical History:  Past Medical History:  Diagnosis Date  . Arthritis   . Hx of CABG   . Hypertension   . MVP (mitral valve prolapse)   . S/P MVR (mitral valve replacement)    Past Surgical History: History reviewed. No pertinent surgical history. HPI:  Tyler Case is a 74 y.o. male presenting with transient monocular vision loss of the left eye. 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    Assessment / Plan / Recommendation Clinical Impression  Pt was seen for a bedside swallow evaluation in the setting of a possible TIA and he presents with suspected functional oropharyngeal swallowing abilities.  Pt was encountered awake/alert with wife present at bedside.  Pt reported that his epiglottis had been damaged in 2015 following a heart surgery, and that he had some coughing with PO intake after that, but he stated that he has not had any difficulty swallowing for the past few years.  Oral mechanism exam was unremarkable.  Pt consumed trials of thin liquid and regular solids.  Pt fed himself independently and he demonstrated good bolus acceptance, timely mastication, suspected timely AP transport/swallow initiation, and consistent hyolaryngeal elevation/excursion to observation and palpation.  No overt s/sx of aspiration were observed with any trials.  Recommend continuation of regular solids and thin liquids with medications administered whole with liquid.  No further skilled ST is warranted at this time.  Please re-consult if additional needs arise.    SLP Visit Diagnosis: Dysphagia,  unspecified (R13.10)    Aspiration Risk  Mild aspiration risk    Diet Recommendation Regular;Thin liquid   Liquid Administration via: Cup;Straw Medication Administration: Whole meds with liquid Supervision: Patient able to self feed Compensations: Slow rate;Small sips/bites Postural Changes: Seated upright at 90 degrees    Other  Recommendations Oral Care Recommendations: Oral care BID   Follow up Recommendations None        Swallow Study   General HPI: Tyler Case is a 74 y.o. male presenting with transient monocular vision loss of the left eye. 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  Type of Study: Bedside Swallow Evaluation Previous Swallow Assessment: None  Diet Prior to this Study: Regular;Thin liquids Temperature Spikes Noted: No Respiratory Status: Room air History of Recent Intubation: No Behavior/Cognition: Alert;Cooperative;Pleasant mood Oral Cavity Assessment: Within Functional Limits Oral Care Completed by SLP: No Oral Cavity - Dentition: Adequate natural dentition Vision: Functional for self-feeding Self-Feeding Abilities: Able to feed self Patient Positioning: Upright in bed Baseline Vocal Quality: Normal Volitional Swallow: Able to elicit    Oral/Motor/Sensory Function Overall Oral Motor/Sensory Function: Within functional limits   Ice Chips Ice chips: Not tested   Thin Liquid Thin Liquid: Within functional limits    Nectar Thick Nectar Thick Liquid: Not tested   Honey Thick Honey Thick Liquid: Not tested   Puree Puree: Not tested   Solid     Solid: Within functional limits Presentation: Self Fed     Colin Mulders., M.S., [place] Office: [phone]  [name] 03/09/2020,12:34 PM

## 2020-03-09 NOTE — Discharge Summary (Signed)
Tyler Case Discharge Summary  Patient name: Tyler Case Medical record number: [id number] Date of birth: 1946/06/30 Age: 74 y.o. Gender: male Date of Admission: 03/07/2020  Date of Discharge: 03/10/20 Admitting Physician: Danna Hefty, DO  Primary Care Provider: Earney Mallet, MD; Cardiologist Dr. Orpah Greek Consultants: Neurology  Indication for Hospitalization: TIA  Discharge Diagnoses/Problem List:  Active Problems:   Amaurosis fugax of left eye   TIA (transient ischemic attack)   Chronic anticoagulation   Mechanical heart valve present   Disposition: Home  Discharge Condition: Stable  Discharge Exam:  GEN: pleasant elderly male, in no acute distress CV: regular rate and rhythm, no murmurs appreciated  RESP: no increased work of breathing, clear to ascultation bilaterally  ABD: soft, non-tender, non-distended.  MSK: no LE edema, or calf tenderness SKIN: warm, dry NEURO: grossly normal, moves all extremities appropriately Taken from Dr. Birdena Crandall progress note on 03/10/20  Brief Case Course:  Tyler Case is a 75 y.o. male presenting with transient monocular vision loss of the left eye. 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 Below is his brief Case course listed by problem list. Refer to H&P for additional information.  Amaurosis-fugax of Left eye  TIA  Presented after transient painless monocular vision loss of left eye night piror. Seen by ophthalmologist prior to arrival at ED, had negative angiogram. Instructed to come to the Emergency Department for further work up. CTA head and neck negative in ED. ESA/CRP negative. CBC and CMP within normal limits. Neurology consulted. HbgA1c 5.5, lipid panel WNL, TSH 0.442, RPR non-reactive, HIV non-reactive, Echo unremarkable. Continued on  home ASA and simvastatin. INR dosed by pharmacy, goal 3-3.5 for mitral valve replacement and with his TIA at INR 2.5. Subtherapeutic on admission, required Lovenox bridge. INR 3.4 on discharge.  HTN Per neurology, patient to have permissive hypertension over the next 5-7 days (< 220/120). Patient's to restart amlodipine on 03/15/20.   Other medical conditions chronic and stable.   Issues for Follow Up:  1. Patient instructed to take Warfarin 2mg  tonight (8/15), then can resume normal Warfarin dosing per his schedule starting tomorrow 8/16 (4mg  daily except for 2mg  on Wednesdays).  2. Patient instructed to have INR checked this week (sometime between 8/16-8/20).  Significant Procedures: None   Significant Labs and Imaging:  Recent Labs  Lab 03/07/20 1549 03/07/20 1549 03/07/20 1609 03/09/20 0255 03/10/20 0313  WBC 6.8  --   --  8.3 7.4  HGB 15.2   < > 15.6 15.5 14.3  HCT 48.0   < > 46.0 46.9 44.6  PLT 176  --   --  193 175   < > = values in this interval not displayed.   Recent Labs  Lab 03/07/20 1549 03/07/20 1549 03/07/20 1609 03/09/20 0255  NA 136  --  139 137  K 4.1   < > 4.1 3.8  CL 102  --  100 103  CO2 25  --   --  27  GLUCOSE 167*  --  163* 105*  BUN 11  --  14 12  CREATININE 1.18  --  1.10 1.08  CALCIUM 8.9  --   --  8.6*  ALKPHOS 41  --   --   --   AST 21  --   --   --   ALT 12  --   --   --  ALBUMIN 4.0  --   --   --    < > = values in this interval not displayed.    CT Angio Head and Neck 8/13 IMPRESSION: No large vessel occlusion or hemodynamically significant stenosis. 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 2 mm right upper lobe nodule. No follow-up needed if patient is low-risk. Non-contrast chest CT can be considered in 12 months if patient is high-risk. This recommendation follows the consensus statement: Guidelines for Management of Incidental Pulmonary Nodules Detected on CT  Images: From the Fleischner Society 2017; Radiology 2017; 284:228-243.  CT Head without Contrast 8/12 IMPRESSION: Tiny age-indeterminate infarcts within the cerebellar hemispheres bilaterally.A tiny age-indeterminate lacunar infarct is also questioned within the posterior right pons.Minimal chronic small vessel ischemic changes within the cerebral white matter. Paranasal sinus disease as described, most notably ethmoidal.Correlate for acute sinusitis.  MR Brain Without Contrast 8/12 IMPRESSION: No evidence of recent infarction, hemorrhage, or mass. Mild chronic microvascular ischemic changes. Few scattered chronic microhemorrhages.  Results/Tests Pending at Time of Discharge: None  Discharge Medications:  Allergies as of 03/10/2020   No Known Allergies     Medication List    TAKE these medications   alfuzosin 10 MG 24 hr tablet Commonly known as: UROXATRAL Take 10 mg by mouth at bedtime.   amLODipine 2.5 MG tablet Commonly known as: NORVASC Take 1 tablet (2.5 mg total) by mouth at bedtime. Start taking on: March 15, 2020 What changed: These instructions start on March 15, 2020. If you are unsure what to do until then, ask your doctor or other care provider.   aspirin EC 81 MG tablet Take 81 mg by mouth at bedtime.   Cholecalciferol 50 MCG (2000 UT) Caps Take 2,000 Units by mouth daily.   gabapentin 100 MG capsule Commonly known as: NEURONTIN Take 100 mg by mouth 2 (two) times daily.   GLUCOSAMINE-CHONDROITIN PO Take 1 tablet by mouth daily. Glucosamine-chondroitin 1500-1200   multivitamin tablet Take 1 tablet by mouth daily.   Nasacort Allergy 24HR 55 MCG/ACT Aero nasal inhaler Generic drug: triamcinolone Place 2 sprays into the nose daily as needed (allergies).   nitroGLYCERIN 0.4 MG SL tablet Commonly known as: NITROSTAT Place 0.4 mg under the tongue every 5 (five) minutes x 3 doses as needed for chest pain.   pantoprazole 40 MG tablet Commonly known as:  PROTONIX Take 40 mg by mouth daily.   simvastatin 40 MG tablet Commonly known as: ZOCOR Take 40 mg by mouth at bedtime.   warfarin 4 MG tablet Commonly known as: COUMADIN Take 2-4 mg by mouth at bedtime. Take 2mg  on Wed and Sat and 4mg  all other days. OR as directed   zolpidem 10 MG tablet Commonly known as: AMBIEN Take 10 mg by mouth at bedtime as needed for sleep.       Discharge Instructions: Please refer to Patient Instructions section of EMR for full details.  Patient was counseled important signs and symptoms that should prompt return to medical care, changes in medications, dietary instructions, activity restrictions, and follow up appointments.   Follow-Up Appointments: None on record  Daisy Floro, DO 03/10/2020, 1:38 PM PGY-3, [place]

## 2020-03-09 NOTE — Plan of Care (Signed)
  Problem: Education: Goal: Knowledge of disease or condition will improve Outcome: Adequate for Discharge Goal: Knowledge of secondary prevention will improve Outcome: Adequate for Discharge

## 2020-03-09 NOTE — Progress Notes (Signed)
Physical Therapy Note  Spoke with occupational therapy after their evaluation of Mr. Noxon this morning.   OT reports patient is functioning at a high level of independence and no physical therapy is indicated at this time. PT is signing-off. Please re-order for any significant change in functional status. Thank you for this referral.  Candie Mile, PT, DPT

## 2020-03-09 NOTE — Evaluation (Signed)
Occupational Therapy Evaluation & Discharge Patient Details Name: Tyler Case MRN: [id number] DOB: 03-19-1946 Today's Date: 03/09/2020    History of Present Illness Tyler Case is a 74 y.o. male presenting with transient monocular vision loss of the left eye. 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    Clinical Impression   PTA, pt lives with wife and completely Independent in all daily tasks without use of AD. Pt presents now with resolved vision loss of eye. Pt demonstrated ability to read menu and order breakfast, read signs in hallway, and scan environment successfully. Pt Independent for all transfers, hallway mobility without AD, ADLs, and picking up ADL items from floor without LOB. No skilled OT needs at acute level or on discharge. OT to sign off.     Follow Up Recommendations  No OT follow up    Equipment Recommendations  None recommended by OT    Recommendations for Other Services       Precautions / Restrictions Precautions Precautions: None Precaution Comments: low fall Restrictions Weight Bearing Restrictions: No      Mobility Bed Mobility Overal bed mobility: Independent                Transfers Overall transfer level: Independent Equipment used: None             General transfer comment: Independent for all transfers, mobility and balance tasks without AD    Balance Overall balance assessment: No apparent balance deficits (not formally assessed)                                         ADL either performed or assessed with clinical judgement   ADL Overall ADL's : Independent                                       General ADL Comments: Independent with hallway distance mobility without AD, donning hospital gown in standing, picking up ADL items from floor. Vision  loss has resolved. Pt able to read breakfast menu and order breakfast, read signs in hallway and scan successfully.     Vision Baseline Vision/History: Wears glasses Wears Glasses: At all times Patient Visual Report: Other (comment) (vision loss has resolved, reports seeing "snowflakes baselin) Vision Assessment?: No apparent visual deficits Additional Comments: Able to see/read signs, menu and scan while wearing glasses. Vision loss has resolved. Pt reports seeing "snowflakes" in vision at baseline     Perception     Praxis      Pertinent Vitals/Pain Pain Assessment: No/denies pain     Hand Dominance Right   Extremity/Trunk Assessment Upper Extremity Assessment Upper Extremity Assessment: Overall WFL for tasks assessed   Lower Extremity Assessment Lower Extremity Assessment: Overall WFL for tasks assessed   Cervical / Trunk Assessment Cervical / Trunk Assessment: Normal   Communication Communication Communication: No difficulties   Cognition Arousal/Alertness: Awake/alert Behavior During Therapy: WFL for tasks assessed/performed Overall Cognitive Status: Within Functional Limits for tasks assessed                                     General Comments  Exercises     Shoulder Instructions      Home Living Family/patient expects to be discharged to:: Private residence Living Arrangements: Spouse/significant other Available Help at Discharge: Family Type of Home: House Home Access: Stairs to enter [employer] of Steps: 1   Home Layout: One level;Other (Comment) (basement with storage)     Bathroom Shower/Tub: Walk-in shower         Home Equipment: Shower seat - built in          Prior Functioning/Environment Level of Independence: Independent        Comments: Independent with ADLs, IADLs and mobilty without AD. Pt was driving, enjoyed riding motorcycles        OT Problem List:        OT Treatment/Interventions:       OT Goals(Current goals can be found in the care plan section) Acute Rehab OT Goals Patient Stated Goal: go home today  OT Frequency:     Barriers to D/C:            Co-evaluation              AM-PAC OT "6 Clicks" Daily Activity     Outcome Measure Help from another person eating meals?: None Help from another person taking care of personal grooming?: None Help from another person toileting, which includes using toliet, bedpan, or urinal?: None Help from another person bathing (including washing, rinsing, drying)?: None Help from another person to put on and taking off regular upper body clothing?: None Help from another person to put on and taking off regular lower body clothing?: None 6 Click Score: 24   End of Session Equipment Utilized During Treatment: Gait belt Nurse Communication: Mobility status  Activity Tolerance: Patient tolerated treatment well Patient left: in chair;with call bell/phone within reach                   Time: [id number] OT Time Calculation (min): 17 min Charges:  OT General Charges $OT Visit: 1 Visit OT Evaluation $OT Eval Low Complexity: 1 Low  Layla Maw, OTR/L  Layla Maw 03/09/2020, 8:07 AM

## 2020-03-09 NOTE — Progress Notes (Signed)
STROKE TEAM PROGRESS NOTE   HISTORY OF PRESENT ILLNESS (per record) Tyler Case is a 74 y.o. male presented to the hospital secondary to having sudden onset of superior vision out of the left eye which lasted for about 1.5 minutes and resolved.  Patient sees Dr. Posey Pronto ophthalmology to whom he did call and explain the symptoms.  At that point Dr. Posey Pronto felt as though he should go to the ED to be observed as this could be an ameurosis fugax.  Currently patient has no symptoms.  He denies any headache, diplopia however he has been having some discomfort in the posterior aspect of his neck.  Patient denies doing any illicit drugs or smoking.  Patient does have a history of CABG and mitral valve replacement thus he is on Coumadin however he states he did miss a dose last night due to being in the hospital.  His INR goal is 2.5-3.5.  His last INR was 2.6 two weeks ago. With regards to potential symptoms of giant cell arteritis, he denies any proximal muscle weakness including denying difficulty going up the stairs or getting things off of shelves.  He denies jaw claudication, new fatigue, temporal tenderness, headaches, changes in his baseline arthritis, fevers, chills, sweats, muscle aches. LKW: 7:15 AM on 03/07/2020 tPA given?: No, no due to the fact that symptoms resolved IA performed?: No, symptoms resolved and not large vessel occlusion Premorbid modified rankin scale: 0 NIH stroke score:0   INTERVAL HISTORY His wife and Dr. Andria Frames are at the bedside.  I personally reviewed history of presenting illness with the patient and wife and electronic medical records and imaging films in PACS.  He had transient painless altitudinal left eye vision loss which has resolved.  He denied accompanying headache.  He denies symptoms of jaw claudication, muscle aches and pains or scalp tenderness.  He does have mild headaches off and on which are not bothersome.  He states 2 weeks ago he had a transient episode of  visual disturbance in the right eye which lasted only a minute did not seek help.   OBJECTIVE Vitals:   03/09/20 0348 03/09/20 0817 03/09/20 1318 03/09/20 1609  BP: 114/62 120/70 140/72 (!) 150/63  Pulse: (!) 58 60 61 (!) 53  Resp: '18 18 18 18  ' Temp: 97.8 F (36.6 C) 97.8 F (36.6 C) (!) 97.3 F (36.3 C) (!) 97.4 F (36.3 C)  TempSrc: Oral Oral Oral Oral  SpO2: 97% 95% 98% 97%  Weight:      Height:        CBC:  Recent Labs  Lab 03/07/20 1549 03/07/20 1549 03/07/20 1609 03/09/20 0255  WBC 6.8  --   --  8.3  NEUTROABS 5.1  --   --   --   HGB 15.2   < > 15.6 15.5  HCT 48.0   < > 46.0 46.9  MCV 89.1  --   --  87.2  PLT 176  --   --  193   < > = values in this interval not displayed.    Basic Metabolic Panel:  Recent Labs  Lab 03/07/20 1549 03/07/20 1549 03/07/20 1609 03/09/20 0255  NA 136   < > 139 137  K 4.1   < > 4.1 3.8  CL 102   < > 100 103  CO2 25  --   --  27  GLUCOSE 167*   < > 163* 105*  BUN 11   < > 14 12  CREATININE  1.18   < > 1.10 1.08  CALCIUM 8.9  --   --  8.6*   < > = values in this interval not displayed.    Lipid Panel:     Component Value Date/Time   CHOL 129 03/08/2020 1036   TRIG 42 03/08/2020 1036   HDL 44 03/08/2020 1036   CHOLHDL 2.9 03/08/2020 1036   VLDL 8 03/08/2020 1036   LDLCALC 77 03/08/2020 1036   HgbA1c:  Lab Results  Component Value Date   HGBA1C 5.5 03/08/2020   Urine Drug Screen: No results found for: LABOPIA, COCAINSCRNUR, LABBENZ, AMPHETMU, THCU, LABBARB  Alcohol Level No results found for: [place] Code Stroke CTA Head W/WO contrast CT Code Stroke CTA Neck W/WO contrast 03/08/2020 IMPRESSION:  No large vessel occlusion or hemodynamically significant stenosis. 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 2 mm right upper lobe nodule. No follow-up needed if patient is low-risk. Non-contrast chest CT can be considered in 12 months if patient  is high-risk. This recommendation follows the consensus statement:   MR BRAIN WO CONTRAST 03/07/2020 IMPRESSION:  No evidence of recent infarction, hemorrhage, or mass. Mild chronic microvascular ischemic changes. Few scattered chronic microhemorrhages.   ECHOCARDIOGRAM COMPLETE 03/08/2020 IMPRESSIONS   1. Left ventricular ejection fraction, by estimation, is 55 to 60%. The left ventricle has normal function. The left ventricle has no regional wall motion abnormalities. There is mild concentric left ventricular hypertrophy. Left ventricular diastolic function could not be evaluated.   2. Right ventricular systolic function is normal. The right ventricular size is normal. There is normal pulmonary artery systolic pressure. The estimated right ventricular systolic pressure is 54.6 mmHg.   3. The mitral valve has been repaired/replaced. No evidence of mitral valve regurgitation. The mean mitral valve gradient is 2.0 mmHg with average heart rate of 68 bpm. There is a present in the mitral position. Echo findings are consistent with normal structure and function of the mitral valve prosthesis.   4. The aortic valve is tricuspid. Aortic valve regurgitation is mild.   5. Aortic dilatation noted. There is mild dilatation of the aortic root.   6. The inferior vena cava is normal in size with greater than 50% respiratory variability, suggesting right atrial pressure of 3 mmHg.   ECG - SR rate 78 BPM. (See cardiology reading for complete details)   PHYSICAL EXAM Blood pressure (!) 150/63, pulse (!) 53, temperature (!) 97.4 F (36.3 C), temperature source Oral, resp. rate 18, height '5\' 9"'  (1.753 m), weight 69.8 kg, SpO2 97 %. Pleasant elderly Caucasian male sitting up comfortably in bed.  Not in distress.  No scalp tenderness.  Both superficial temporal pulses are well felt. . Afebrile. Head is nontraumatic. Neck is supple without bruit.    Cardiac exam no murmur or gallop. Lungs are clear to auscultation.  Distal pulses are well felt. Neurological Exam ;  Awake  Alert oriented x 3. Normal speech and language.eye movements full without nystagmus.fundi were not visualized. Vision acuity and fields appear normal. Hearing is normal. Palatal movements are normal. Face symmetric. Tongue midline. Normal strength, tone, reflexes and coordination. Normal sensation. Gait deferred.      ASSESSMENT/PLAN Mr. Viral Schramm is a 74 y.o. 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 Marland Kitchen He did not receive IV t-PA due to resolution of deficits and anticoagulation.  Probable TIA: due to sub therapeutic warfarin level.  Doubt temporal arteritis with normal ESR and absence of other associated symptoms  Resultant no deficits  Code Stroke CT Head - not ordered  CT head - not ordered  MRI head - No evidence of recent infarction, hemorrhage, or mass. Mild chronic microvascular ischemic changes. Few scattered chronic microhemorrhages.   MRA head - not ordered  CTA H&N - No large vessel occlusion or hemodynamically significant stenosis.  CT Perfusion - not ordered  Carotid Doppler - CTA neck ordered - carotid dopplers not indicated.  2D Echo - EF 55 - 60%. No cardiac source of emboli identified.   Sars Corona Virus 2 - negative  LDL - 77  HgbA1c - 5.5  UDS - not ordered  VTE prophylaxis - Lovenox / warfarin Diet  Diet Order            Diet Heart Room service appropriate? Yes; Fluid consistency: Thin  Diet effective ____                 aspirin 81 mg daily and warfarin daily prior to admission, now on aspirin 81 mg daily, warfarin daily and Lovenox  Patient counseled to be compliant with his antithrombotic medications  Ongoing aggressive stroke risk factor management  Therapy recommendations:   No f/u recommended  Disposition:  Pending  Hypertension  Home BP meds: Norvasc  Current BP meds: Norvasc  Stable . Permissive hypertension (OK if < 220/120) but gradually normalize in 5-7 days  . Long-term BP goal normotensive  Hyperlipidemia  Home Lipid lowering medication: Zocor 40 mg daily  LDL 77, goal < 70  Current lipid lowering medication: Zocor 40 mg daily  Continue statin at discharge  Other Stroke Risk Factors  Advanced age  ETOH use, advised to drink no more than 1 alcoholic beverage per day.  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   2 mm right upper lobe nodule.  Aortic dilatation noted. There is mild dilatation of the aortic root.  Hospital day # 0 He presented with transient left eye painless altitudinal visual field loss possibly retinal artery branch occlusion etiology subtherapeutic INR from his mechanical heart valve doubt temporal arteritis.  Recommend close INR monitoring with INR goal between 3-3.5 given his mechanical heart valve.  Agree with plan for bridging    with Lovenox till INR is optimal. if symptoms recur or has further headaches may need to consider temporal artery biopsy.  Long discussion with the patient and wife at the bedside as well as with Dr. Andria Frames and answered questions.  Greater than 50% time during this 35-minute visit was spent on counseling and coordination of care about his transient vision loss and discussion about differential diagnosis and treatment plan and answering questions Antony Contras, MD To contact Stroke Continuity provider, please refer to [email or web address] After hours, contact General Neurology

## 2020-03-09 NOTE — Progress Notes (Signed)
ANTICOAGULATION CONSULT NOTE Pharmacy Consult for warfarin Indication: Mech MVR  No Known Allergies  Patient Measurements: Height: 5\' 9"  (175.3 cm) Weight: 69.8 kg (153 lb 14.1 oz) IBW/kg (Calculated) : 70.7  Vital Signs: Temp: 97.8 F (36.6 C) (08/14 0817) Temp Source: Oral (08/14 0817) BP: 120/70 (08/14 0817) Pulse Rate: 60 (08/14 0817)  Labs: Recent Labs    03/07/20 1549 03/07/20 1549 03/07/20 1609 03/08/20 1345 03/09/20 0255  HGB 15.2   < > 15.6  --  15.5  HCT 48.0  --  46.0  --  46.9  PLT 176  --   --   --  193  APTT 43*  --   --   --   --   LABPROT 26.5*  --   --  22.8* 22.6*  INR 2.5*  --   --  2.1* 2.1*  CREATININE 1.18  --  1.10  --  1.08   < > = values in this interval not displayed.    Estimated Creatinine Clearance: 59.2 mL/min (by C-G formula based on SCr of 1.08 mg/dL).  Medical History: Past Medical History:  Diagnosis Date  . Arthritis   . Hx of CABG   . Hypertension   . MVP (mitral valve prolapse)   . S/P MVR (mitral valve replacement)    Assessment: 74 yo M on warfarin for hx mechanical mitral valve replacement. INR 2.5 on admission. Admitted with possible TIA with transient vision loss. Of note, had conjunctival hemorrhage in the right eye on 03/05/20 during valsalva (blowing nose). No other active bleeding.   PTA Warfarin dose: 2mg  on Wed/Sat and 4mg  all other days (confirmed with patient 8/13). Last dose PTA was 03/06/20 at 2100.  INR today is subtherapeutic at 2.1 after boosted dose yesterday. Missed dose 8/12 in ED d/t pending stroke/TIA work up. CBC stable, no s/sx bleeding reported. Will continue with higher dose to ensure INR trends towards goal range.  Given subtherapeutic INR for mechanical valve, plan to bridge with lovenox until INR at goal. Dr. Nita Sells in agreement with bridging.  Goal of Therapy:  INR 2.5- 3.5 Monitor platelets by anticoagulation protocol: Yes   Plan:  Warfarin 7.5mg  x1 Start lovenox 70 mg BID for bridging  until INR 2.5-3.5 Monitor daily INR, CBC/plt Monitor for signs/symptoms of bleeding   Fara Olden, PharmD PGY-1 Pharmacy Resident 03/09/2020 10:42 AM  Please check [email or web address] for unit-specific pharmacy phone numbers.

## 2020-03-09 NOTE — Progress Notes (Addendum)
Family Medicine Teaching Service Daily Progress Note Intern Pager: [phone]  Patient name: Tyler Case Medical record number: [id number] Date of birth: Nov 22, 1945 Age: 74 y.o. Gender: male  Primary Care Provider: Earney Mallet, MD Consultants: Neurology  Code Status: FULL  Pt Overview and Major Events to Date:  Admitted 8/13  Assessment and Plan: Tyler Case is a 74 y.o. male presenting with transient monocular vision loss of the left eye. 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   Amaurosis-fugax of left eye: Acute, resolved CTA head and neck negative. MRI unremarkable. ESR/CRP negative. Echo shows EF 55-60%, no regional wall motion abnormalities. On ASA. Risk stratification labs normal. BP normotensive. Neuro exam mostly unremarkable, some visual field difficulty with superior outer quadrants bilaterally. INR subtherapeutic at 2.1, goal 2.5-3.5 for mechanical mitral valve replacement, however will aim for goal 3-3.5 due to TIA. - Awaiting Neuro recommendations - Continue ASA, Simvastatin - Lovenox bridging by pharmacy, INR goal 3-3.5  Mechanical Mitral Valve  CAD s/p 2-vessel Bypass: Chronic, stable Home meds: Warfarin and ASA. INR on admission 2.5. INR goal 3-3.5. - warfarin per pharmacy; INR goal as above - continue ASA  HTN: Chronic, stable  BP this a.m. 114/62. Home meds: Amlodipine 2.57m QD - continue home meds - consider increasing if indicated - BP goal: <140/90  HLD: Chronic, stable No records in chart. Home meds: Simvastatin 457mQD. - follow up lipid panel - continue Simvastatin 4072mD  GERD: Chronic, stable - continue home Protonix 72m61m  BPH: Chronic, stable Chronic. [place] Alfuzosin 10mg54m - continue home meds  Peripheral Neuropathy: Chronic, stable Home  meds: Gabapentin 100mg 37m- continue home meds  Right Upper Lobe Pulmonary Nodule: Stable  CTA head/neck notable for a 2mm ri5m upper lobe nodule. Never smoker thus low risk.  - No follow up indicated if low risk. Defer further work up to PCP  FEN/GI: Heart healthy PPx: Warfarin, dosed by pharmacy  Disposition: Dispo pending therapeutic INR  Subjective:  Patient has no complaints today. Feels well and back to his normal self. He does not follow with a Neurologist but has been following with Dr. Patel, Pose[place] Prontophthalm[place] find [place] around there. He tells me he has a follow up appointment with his ophthalmologist in 4 weeks.   Objective: Temp:  [97.8 F (36.6 C)-98.8 F (37.1 C)] 97.8 F (36.6 C) (08/14 0348) Pulse Rate:  [58-92] 58 (08/14 0348) Resp:  [18-23] 18 (08/14 0348) BP: (114-169)/(62-85) 114/62 (08/14 0348) SpO2:  [94 %-98 %] 97 % (08/14 0348) Weight:  [69.8 kg] 69.8 kg (08/13 2118) Physical Exam: General: alert, awake, no acute distress Cardiovascular: RRR Respiratory: CTAB Neuro: Grossly intact, some visual field difficulties upper outer quadrants b/l, 5/5 strength upper and lower extremities b/l, normal gait  Extremities: no edema  Laboratory: Recent Labs  Lab 03/07/20 1549 03/07/20 1609 03/09/20 0255  WBC 6.8  --  8.3  HGB 15.2 15.6 15.5  HCT 48.0 46.0 46.9  PLT 176  --  193   Recent Labs  Lab 03/07/20 1549 03/07/20 1609 03/09/20 0255  NA 136 139 137  K 4.1 4.1 3.8  CL 102 100 103  CO2 25  --  27  BUN '11 14 12  ' CREATININE 1.18 1.10 1.08  CALCIUM  8.9  --  8.6*  PROT 6.3*  --   --   BILITOT 0.8  --   --   ALKPHOS 41  --   --   ALT 12  --   --   AST 21  --   --   GLUCOSE 167* 163* 105*    Imaging/Diagnostic Tests: Echo 8/13 IMPRESSIONS  1. Left ventricular ejection fraction, by estimation, is 55 to 60%. The  left ventricle has normal function. The left  ventricle has no regional  wall motion abnormalities. There is mild concentric left ventricular  hypertrophy. Left ventricular diastolic  function could not be evaluated.  2. Right ventricular systolic function is normal. The right ventricular  size is normal. There is normal pulmonary artery systolic pressure. The  estimated right ventricular systolic pressure is 25.8 mmHg.  3. The mitral valve has been repaired/replaced. No evidence of mitral  valve regurgitation. The mean mitral valve gradient is 2.0 mmHg with  average heart rate of 68 bpm. There is a present in the mitral position.  Echo findings are consistent with normal structure and function of the mitral valve prosthesis.  4. The aortic valve is tricuspid. Aortic valve regurgitation is mild.  5. Aortic dilatation noted. There is mild dilatation of the aortic root.  6. The inferior vena cava is normal in size with greater than 50%  respiratory variability, suggesting right atrial pressure of 3 mmHg.   Sharion Settler, DO 03/09/2020, 7:37 AM PGY-1, [place] Intern pager: [phone], text pages welcome

## 2020-03-10 DIAGNOSIS — Z952 Presence of prosthetic heart valve: Secondary | ICD-10-CM | POA: Diagnosis not present

## 2020-03-10 DIAGNOSIS — G459 Transient cerebral ischemic attack, unspecified: Secondary | ICD-10-CM | POA: Diagnosis not present

## 2020-03-10 DIAGNOSIS — G453 Amaurosis fugax: Secondary | ICD-10-CM | POA: Diagnosis not present

## 2020-03-10 DIAGNOSIS — Z7901 Long term (current) use of anticoagulants: Secondary | ICD-10-CM | POA: Diagnosis not present

## 2020-03-10 LAB — CBC
HCT: 44.6 % (ref 39.0–52.0)
Hemoglobin: 14.3 g/dL (ref 13.0–17.0)
MCH: 28.1 pg (ref 26.0–34.0)
MCHC: 32.1 g/dL (ref 30.0–36.0)
MCV: 87.8 fL (ref 80.0–100.0)
Platelets: 175 10*3/uL (ref 150–400)
RBC: 5.08 MIL/uL (ref 4.22–5.81)
RDW: 14.9 % (ref 11.5–15.5)
WBC: 7.4 10*3/uL (ref 4.0–10.5)
nRBC: 0 % (ref 0.0–0.2)

## 2020-03-10 LAB — PROTIME-INR
INR: 3.4 — ABNORMAL HIGH (ref 0.8–1.2)
Prothrombin Time: 33.6 seconds — ABNORMAL HIGH (ref 11.4–15.2)

## 2020-03-10 MED ORDER — WARFARIN SODIUM 2 MG PO TABS
2.0000 mg | ORAL_TABLET | Freq: Once | ORAL | Status: DC
  Filled 2020-03-10: qty 1

## 2020-03-10 MED ORDER — AMLODIPINE BESYLATE 2.5 MG PO TABS: 2.5000 mg | ORAL_TABLET | Freq: Every day | ORAL | Status: AC

## 2020-03-10 MED ORDER — STROKE: EARLY STAGES OF RECOVERY BOOK
Freq: Once | Status: DC
  Filled 2020-03-10: qty 1

## 2020-03-10 NOTE — Progress Notes (Signed)
[place] for warfarin Indication: Mech MVR  No Known Allergies  Patient Measurements: Height: 5\' 9"  (175.3 cm) Weight: 69.8 kg (153 lb 14.1 oz) IBW/kg (Calculated) : 70.7  Vital Signs: Temp: 97.4 F (36.3 C) (08/15 0744) Temp Source: Axillary (08/15 0744) BP: 144/74 (08/15 0744) Pulse Rate: 64 (08/15 0744)  Labs: Recent Labs    03/07/20 1549 03/07/20 1549 03/07/20 1609 03/07/20 1609 03/08/20 1345 03/09/20 0255 03/10/20 0313  HGB 15.2   < > 15.6   < >  --  15.5 14.3  HCT 48.0   < > 46.0  --   --  46.9 44.6  PLT 176  --   --   --   --  193 175  APTT 43*  --   --   --   --   --   --   LABPROT 26.5*   < >  --   --  22.8* 22.6* 33.6*  INR 2.5*   < >  --   --  2.1* 2.1* 3.4*  CREATININE 1.18  --  1.10  --   --  1.08  --    < > = values in this interval not displayed.    Estimated Creatinine Clearance: 59.2 mL/min (by C-G formula based on SCr of 1.08 mg/dL).  Medical History: Past Medical History:  Diagnosis Date  . Arthritis   . Hx of CABG   . Hypertension   . MVP (mitral valve prolapse)   . S/P MVR (mitral valve replacement)    Assessment: 74 yo M on warfarin for hx mechanical mitral valve replacement. INR 2.5 on admission. Admitted with TIA d/t subtherapeutic INR. Of note, had conjunctival hemorrhage in the right eye on 03/05/20 during valsalva (blowing nose). No other active bleeding.   PTA Warfarin dose: 2mg  on Wed/Sat and 4mg  all other days (confirmed with patient 8/13). Last dose PTA was 03/06/20 at 2100.  Missed dose 8/12 in ED d/t pending stroke/TIA work up.   INR today is therapeutic at 3.4. Significant increase in INR from yesterday, likely d/t boosted doses on 8/13 and 8/14, anticipate will continue to trend up from boost. Will give reduced dose tonight to avoid supratherapeutic INR.  CBC stable, no s/sx bleeding reported.   Goal of Therapy:  INR 3-3.5 (per MD given TIA when INR was 2.5 on admission) Monitor  platelets by anticoagulation protocol: Yes   Plan:  Warfarin 2 mg x1 Discontinue Lovenox bridge given INR > 3 today Monitor daily INR, CBC/plt  Monitor for signs/symptoms of bleeding   Fara Olden, PharmD PGY-1 Pharmacy Resident 03/10/2020 7:50 AM  Please check [email or web address] for unit-specific pharmacy phone numbers.

## 2020-03-10 NOTE — Progress Notes (Signed)
Family Medicine Teaching Service Daily Progress Note Intern Pager: [phone]  Patient name: Tyler Case Medical record number: [id number] Date of birth: 30-Oct-1945 Age: 74 y.o. Gender: male  Primary Care Provider: Earney Mallet, MD Consultants: Neurology  Code Status: FULL  Pt Overview and Major Events to Date:  03/08/20: Admitted  03/09/20: Neurology evaluation    Assessment and Plan: Tyler Case is a 74 y.o. male who presented with transient monocular vision loss of the left eye. 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   ?Amaurosis-fugax of left eye: Acute, resolved  Probable TIA  Etiology likely due to subtherapeutic Warfarin levels as patient has mechanical valve with INR 2.5 on admission. CTA head and neck, and MRI unremarkable.  ESR/CRP negative not giant cell arteritis. BP remains normotensive. INR therapeutic today at 3.4. Patient stable for discharge.  - Neurology consulted, appreciate recommendations  - Continue ASA, Simvastatin - Lovenox bridging by pharmacy, INR goal 3-3.5  Mechanical Mitral Valve  CAD s/p 2-vessel Bypass: Chronic, stable Home meds: Warfarin and ASA. INR on admission 2.5. INR goal >2.5-3.5. - warfarin per pharmacy; INR goal as above - continue ASA  HTN: Chronic, stable BP 144/74 today. Home meds: Amlodipine 2.77m QD - Hold home meds due to permissive HTN  -Permissive HTN ( < 220/120), allow BP to normalize in 5-7 days    HLD: Chronic, stable Lipid panel on admission LDL 77, HDL 44, total chol 129, TGs 42. Home meds: Simvastatin 473mQD. - continue Simvastatin 4063mD  GERD: Chronic, stable - continue home Protonix 35m62m  BPH: Chronic, stable Chronic. Home meds: Alfuzosin 10mg69m - continue home meds  Peripheral Neuropathy: Chronic, stable Home meds: Gabapentin 100mg 33m- continue  home meds  Right Upper Lobe Pulmonary Nodule: Stable  CTA head/neck notable for a 2mm ri58m upper lobe nodule. Never smoker thus low risk.  - No follow up indicated if low risk. Defer further work up to PCP  FEN/GI: Heart healthy, repelte electrolytes as needed  PPx: Warfarin, dosed by pharmacy  Disposition: likely dc today   Subjective:  Patient without complaints today. Pt ready to go home.  No significant overnight events.   Objective: Temp:  [97.3 F (36.3 C)-98 F (36.7 C)] 98 F (36.7 C) (08/15 0010) Pulse Rate:  [53-67] 55 (08/15 0010) Resp:  [18] 18 (08/15 0010) BP: (114-150)/(62-72) 130/70 (08/15 0010) SpO2:  [95 %-98 %] 97 % (08/15 0010) Physical Exam:  GEN: pleasant elderly male, in no acute distress CV: regular rate and rhythm, no murmurs appreciated  RESP: no increased work of breathing, clear to ascultation bilaterally  ABD: soft, non-tender, non-distended.  MSK: no LE edema, or calf tenderness SKIN: warm, dry NEURO: grossly normal, moves all extremities appropriately     Laboratory: Recent Labs  Lab 03/07/20 1549 03/07/20 1609 03/09/20 0255  WBC 6.8  --  8.3  HGB 15.2 15.6 15.5  HCT 48.0 46.0 46.9  PLT 176  --  193   Recent Labs  Lab 03/07/20 1549 03/07/20 1609 03/09/20 0255  NA 136 139 137  K 4.1 4.1 3.8  CL 102 100 103  CO2 25  --  27  BUN _0 CREATININE 1.18 1.10 1.08  CALCIUM 8.9  --  8.6*  PROT 6.3*  --   --   BILITOT 0.8  --   --   ALKPHOS 41  --   --  ALT 12  --   --   AST 21  --   --   GLUCOSE 167* 163* 105*    Imaging/Diagnostic Tests: Echo 8/13 IMPRESSIONS  1. Left ventricular ejection fraction, by estimation, is 55 to 60%. The  left ventricle has normal function. The left ventricle has no regional  wall motion abnormalities. There is mild concentric left ventricular  hypertrophy. Left ventricular diastolic  function could not be evaluated.  2. Right ventricular systolic function is normal. The right  ventricular  size is normal. There is normal pulmonary artery systolic pressure. The  estimated right ventricular systolic pressure is 62.8 mmHg.  3. The mitral valve has been repaired/replaced. No evidence of mitral  valve regurgitation. The mean mitral valve gradient is 2.0 mmHg with  average heart rate of 68 bpm. There is a present in the mitral position.  Echo findings are consistent with normal structure and function of the mitral valve prosthesis.  4. The aortic valve is tricuspid. Aortic valve regurgitation is mild.  5. Aortic dilatation noted. There is mild dilatation of the aortic root.  6. The inferior vena cava is normal in size with greater than 50%  respiratory variability, suggesting right atrial pressure of 3 mmHg.   No results found.   Lyndee Hensen, DO 03/10/2020, 3:15 AM PGY-2, [place] Intern pager: [phone], text pages welcome

## 2020-03-10 NOTE — Plan of Care (Signed)
Patient stable. Discussed plan of care with patient and spouse, agreeable with plan. Discusses stroke education via stroke handbook, verbalizes understanding, denies questions or concerns at this time.  Problem: Education: Goal: Knowledge of disease or condition will improve 03/10/2020 1358 by Burnis Medin, RN Outcome: Adequate for Discharge 03/10/2020 1340 by Burnis Medin, RN Outcome: Adequate for Discharge 03/10/2020 0843 by Burnis Medin, RN Outcome: Progressing 03/10/2020 0842 by Burnis Medin, RN Outcome: Progressing Goal: Knowledge of secondary prevention will improve 03/10/2020 1358 by Burnis Medin, RN Outcome: Adequate for Discharge 03/10/2020 1340 by Burnis Medin, RN Outcome: Adequate for Discharge

## 2020-03-10 NOTE — Plan of Care (Signed)
Problem: Education: Goal: Knowledge of disease or condition will improve 03/10/2020 1402 by Burnis Medin, RN Outcome: Adequate for Discharge 03/10/2020 1358 by Burnis Medin, RN Outcome: Adequate for Discharge 03/10/2020 1340 by Burnis Medin, RN Outcome: Adequate for Discharge 03/10/2020 0843 by Burnis Medin, RN Outcome: Progressing 03/10/2020 0842 by Burnis Medin, RN Outcome: Progressing Goal: Knowledge of secondary prevention will improve 03/10/2020 1402 by Burnis Medin, RN Outcome: Adequate for Discharge 03/10/2020 1358 by Burnis Medin, RN Outcome: Adequate for Discharge 03/10/2020 1340 by Burnis Medin, RN Outcome: Adequate for Discharge   Problem: Education: Goal: Knowledge of General Education information will improve Description: Including pain rating scale, medication(s)/side effects and non-pharmacologic comfort measures 03/10/2020 1430 by Burnis Medin, RN Outcome: Adequate for Discharge 03/10/2020 1429 by Burnis Medin, RN Outcome: Adequate for Discharge   Problem: Health Behavior/Discharge Planning: Goal: Ability to manage health-related needs will improve 03/10/2020 1430 by Burnis Medin, RN Outcome: Adequate for Discharge 03/10/2020 1429 by Burnis Medin, RN Outcome: Adequate for Discharge   Problem: Clinical Measurements: Goal: Ability to maintain clinical measurements within normal limits will improve 03/10/2020 1430 by Burnis Medin, RN Outcome: Adequate for Discharge 03/10/2020 1429 by Burnis Medin, RN Outcome: Adequate for Discharge Goal: Will remain free from infection 03/10/2020 1430 by Burnis Medin, RN Outcome: Adequate for Discharge 03/10/2020 1429 by Burnis Medin, RN Outcome: Adequate for Discharge Goal: Diagnostic test results will improve 03/10/2020 1430 by Burnis Medin, RN Outcome: Adequate for Discharge 03/10/2020 1429 by Burnis Medin,  RN Outcome: Adequate for Discharge Goal: Respiratory complications will improve 03/10/2020 1430 by Burnis Medin, RN Outcome: Adequate for Discharge 03/10/2020 1429 by Burnis Medin, RN Outcome: Adequate for Discharge Goal: Cardiovascular complication will be avoided 03/10/2020 1430 by Burnis Medin, RN Outcome: Adequate for Discharge 03/10/2020 1429 by Burnis Medin, RN Outcome: Adequate for Discharge   Problem: Activity: Goal: Risk for activity intolerance will decrease 03/10/2020 1430 by Burnis Medin, RN Outcome: Adequate for Discharge 03/10/2020 1429 by Burnis Medin, RN Outcome: Adequate for Discharge   Problem: Nutrition: Goal: Adequate nutrition will be maintained 03/10/2020 1430 by Burnis Medin, RN Outcome: Adequate for Discharge 03/10/2020 1429 by Burnis Medin, RN Outcome: Adequate for Discharge   Problem: Coping: Goal: Level of anxiety will decrease 03/10/2020 1430 by Burnis Medin, RN Outcome: Adequate for Discharge 03/10/2020 1429 by Burnis Medin, RN Outcome: Adequate for Discharge   Problem: Elimination: Goal: Will not experience complications related to bowel motility 03/10/2020 1430 by Burnis Medin, RN Outcome: Adequate for Discharge 03/10/2020 1429 by Burnis Medin, RN Outcome: Adequate for Discharge Goal: Will not experience complications related to urinary retention 03/10/2020 1430 by Burnis Medin, RN Outcome: Adequate for Discharge 03/10/2020 1429 by Burnis Medin, RN Outcome: Adequate for Discharge   Problem: Pain Managment: Goal: General experience of comfort will improve 03/10/2020 1430 by Burnis Medin, RN Outcome: Adequate for Discharge 03/10/2020 1429 by Burnis Medin, RN Outcome: Adequate for Discharge   Problem: Safety: Goal: Ability to remain free from injury will improve 03/10/2020 1430 by Burnis Medin, RN Outcome: Adequate for  Discharge 03/10/2020 1429 by Burnis Medin, RN Outcome: Adequate for Discharge   Problem: Skin Integrity: Goal: Risk for impaired skin integrity will decrease 03/10/2020 1430 by Burnis Medin, RN Outcome: Adequate for Discharge 03/10/2020 1429 by Burnis Medin, RN Outcome: Adequate for Discharge

## 2020-03-10 NOTE — Progress Notes (Signed)
STROKE TEAM PROGRESS NOTE      INTERVAL HISTORY He is sitting up in bed.  His INR today is optimal 3.4.  He has no complaints.  He wants to go home.  Vital signs stable.  Neuro exam unchanged.   OBJECTIVE Vitals:   03/09/20 1940 03/10/20 0010 03/10/20 0355 03/10/20 0744  BP: (!) 145/62 130/70 121/70 (!) 144/74  Pulse: 67 (!) 55 61 64  Resp: _0 Temp: 98 F (36.7 C) 98 F (36.7 C) (!) 97.5 F (36.4 C) (!) 97.4 F (36.3 C)  TempSrc: Oral Oral Oral Axillary  SpO2: 97% 97% 95% 96%  Weight:      Height:        CBC:  Recent Labs  Lab 03/07/20 1549 03/07/20 1609 03/09/20 0255 03/10/20 0313  WBC 6.8   < > 8.3 7.4  NEUTROABS 5.1  --   --   --   HGB 15.2   < > 15.5 14.3  HCT 48.0   < > 46.9 44.6  MCV 89.1   < > 87.2 87.8  PLT 176   < > 193 175   < > = values in this interval not displayed.    Basic Metabolic Panel:  Recent Labs  Lab 03/07/20 1549 03/07/20 1549 03/07/20 1609 03/09/20 0255  NA 136   < > 139 137  K 4.1   < > 4.1 3.8  CL 102   < > 100 103  CO2 25  --   --  27  GLUCOSE 167*   < > 163* 105*  BUN 11   < > 14 12  CREATININE 1.18   < > 1.10 1.08  CALCIUM 8.9  --   --  8.6*   < > = values in this interval not displayed.    Lipid Panel:     Component Value Date/Time   CHOL 129 03/08/2020 1036   TRIG 42 03/08/2020 1036   HDL 44 03/08/2020 1036   CHOLHDL 2.9 03/08/2020 1036   VLDL 8 03/08/2020 1036   LDLCALC 77 03/08/2020 1036   HgbA1c:  Lab Results  Component Value Date   HGBA1C 5.5 03/08/2020   Urine Drug Screen: No results found for: LABOPIA, COCAINSCRNUR, LABBENZ, AMPHETMU, THCU, LABBARB  Alcohol Level No results found for: [place] Code Stroke CTA Head W/WO contrast CT Code Stroke CTA Neck W/WO contrast 03/08/2020 IMPRESSION:  No large vessel occlusion or hemodynamically significant stenosis. 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 2 mm right upper  lobe nodule. No follow-up needed if patient is low-risk. Non-contrast chest CT can be considered in 12 months if patient is high-risk. This recommendation follows the consensus statement:   MR BRAIN WO CONTRAST 03/07/2020 IMPRESSION:  No evidence of recent infarction, hemorrhage, or mass. Mild chronic microvascular ischemic changes. Few scattered chronic microhemorrhages.   ECHOCARDIOGRAM COMPLETE 03/08/2020 IMPRESSIONS   1. Left ventricular ejection fraction, by estimation, is 55 to 60%. The left ventricle has normal function. The left ventricle has no regional wall motion abnormalities. There is mild concentric left ventricular hypertrophy. Left ventricular diastolic function could not be evaluated.   2. Right ventricular systolic function is normal. The right ventricular size is normal. There is normal pulmonary artery systolic pressure. The estimated right ventricular systolic pressure is 03.5 mmHg.   3. The mitral valve has been repaired/replaced. No evidence of mitral valve regurgitation. The mean mitral valve  gradient is 2.0 mmHg with average heart rate of 68 bpm. There is a present in the mitral position. Echo findings are consistent with normal structure and function of the mitral valve prosthesis.   4. The aortic valve is tricuspid. Aortic valve regurgitation is mild.   5. Aortic dilatation noted. There is mild dilatation of the aortic root.   6. The inferior vena cava is normal in size with greater than 50% respiratory variability, suggesting right atrial pressure of 3 mmHg.   ECG - SR rate 78 BPM. (See cardiology reading for complete details)   PHYSICAL EXAM   Blood pressure (!) 144/74, pulse 64, temperature (!) 97.4 F (36.3 C), temperature source Axillary, resp. rate 18, height _0  (1.753 m), weight 69.8 kg, SpO2 96 %. Pleasant elderly Caucasian male sitting up comfortably in bed.  Not in distress.  No scalp tenderness.  Both superficial temporal pulses are well felt. . Afebrile.  Head is nontraumatic. Neck is supple without bruit.    Cardiac exam no murmur or gallop. Lungs are clear to auscultation. Distal pulses are well felt. Neurological Exam ;  Awake  Alert oriented x 3. Normal speech and language.eye movements full without nystagmus.fundi were not visualized. Vision acuity and fields appear normal. Hearing is normal. Palatal movements are normal. Face symmetric. Tongue midline. Normal strength, tone, reflexes and coordination. Normal sensation. Gait deferred.      ASSESSMENT/PLAN [name] is a 74 y.o. 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 Marland Kitchen He did not receive IV t-PA due to resolution of deficits and anticoagulation.  Probable TIA: due to sub therapeutic warfarin level.  Doubt temporal arteritis with normal ESR and absence of other associated symptoms  Resultant no deficits  Code Stroke CT Head - not ordered  CT head - not ordered  MRI head - No evidence of recent infarction, hemorrhage, or mass. Mild chronic microvascular ischemic changes. Few scattered chronic microhemorrhages.   MRA head - not ordered  CTA H&N - No large vessel occlusion or hemodynamically significant stenosis.  CT Perfusion - not ordered  Carotid Doppler - CTA neck ordered - carotid dopplers not indicated.  2D Echo - EF 55 - 60%. No cardiac source of emboli identified.   Sars Corona Virus 2 - negative  LDL - 77  HgbA1c - 5.5  UDS - not ordered  VTE prophylaxis - Lovenox / warfarin Diet  Diet Order            Diet Heart Room service appropriate? Yes; Fluid consistency: Thin  Diet effective ____                 aspirin 81 mg daily and warfarin daily prior to admission, now on aspirin 81 mg daily, warfarin daily and Lovenox  Patient counseled to be  compliant with his antithrombotic medications  Ongoing aggressive stroke risk factor management  Therapy recommendations:  No f/u recommended  Disposition:  Pending  Hypertension  Home BP meds: Norvasc  Current BP meds: Norvasc  Stable . Permissive hypertension (OK if < 220/120) but gradually normalize in 5-7 days  . Long-term BP goal normotensive  Hyperlipidemia  Home Lipid lowering medication: Zocor 40 mg daily  LDL 77, goal < 70  Current lipid lowering medication: Zocor 40 mg daily  Continue statin  at discharge  Other Stroke Risk Factors  Advanced age  ETOH use, advised to drink no more than 1 alcoholic beverage per day.  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   2 mm right upper lobe nodule.  Aortic dilatation noted. There is mild dilatation of the aortic root.  Hospital day # 0   PLAN DC home with INR goal between 3-3 0.5.  Follow-up as an outpatient stroke clinic in 6 weeks. Antony Contras, MD  To contact Stroke Continuity provider, please refer to [email or web address] After hours, contact General Neurology

## 2021-03-06 ENCOUNTER — Other Ambulatory Visit: Payer: Self-pay | Admitting: Dermatology

## 2021-03-12 ENCOUNTER — Other Ambulatory Visit: Payer: Self-pay | Admitting: Dermatology

## 2021-03-26 ENCOUNTER — Encounter: Payer: Self-pay | Admitting: Internal Medicine

## 2021-04-15 ENCOUNTER — Encounter: Payer: Self-pay | Admitting: Internal Medicine

## 2021-04-15 ENCOUNTER — Ambulatory Visit (INDEPENDENT_AMBULATORY_CARE_PROVIDER_SITE_OTHER): Payer: Medicare Other | Admitting: Internal Medicine

## 2021-04-15 VITALS — BP 122/60 | HR 64 | Ht 69.0 in | Wt 152.4 lb

## 2021-04-15 DIAGNOSIS — K219 Gastro-esophageal reflux disease without esophagitis: Secondary | ICD-10-CM | POA: Diagnosis not present

## 2021-04-15 DIAGNOSIS — R195 Other fecal abnormalities: Secondary | ICD-10-CM | POA: Diagnosis not present

## 2021-04-15 DIAGNOSIS — K5900 Constipation, unspecified: Secondary | ICD-10-CM

## 2021-04-15 MED ORDER — SUTAB 1479-225-188 MG PO TABS: ORAL_TABLET | ORAL | 0 refills | Status: DC

## 2021-04-15 NOTE — Patient Instructions (Addendum)
If you are age 75 or older, your body mass index should be between 23-30. Your Body mass index is 22.5 kg/m. If this is out of the aforementioned range listed, please consider follow up with your Primary Care Provider.  If you are age 77 or younger, your body mass index should be between 19-25. Your Body mass index is 22.5 kg/m. If this is out of the aformentioned range listed, please consider follow up with your Primary Care Provider.   __________________________________________________________  The [facility] providers would like to encourage you to use [facility] to communicate with providers for non-urgent requests or questions.  Due to long hold times on the telephone, sending your provider a message by [facility] may be a faster and more efficient way to get a response.  Please allow 48 business hours for a response.  Please remember that this is for non-urgent requests.   You have been scheduled for a colonoscopy. Please follow written instructions given to you at your visit today.  Please pick up your prep supplies at the pharmacy within the next 1-3 days. If you use inhalers (even only as needed), please bring them with you on the day of your procedure.  You will be contacted by our office prior to your procedure for directions on holding your Coumadin.  If you do not hear from our office 1 week prior to your scheduled procedure, please call [phone] to discuss.    It was a pleasure to see you today!  Thank you for trusting me with your gastrointestinal care!
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
## 2021-04-21 ENCOUNTER — Telehealth: Payer: Self-pay

## 2021-04-21 NOTE — Telephone Encounter (Signed)
Follow up call made to Dr Sanjuan Dame office for cardiac clearance. Left message to return call or fax back request to hold the coumadin. He needs to start on Thursday 04-24-2021.  Dr Sanjuan Dame office [phone]

## 2021-04-22 NOTE — Telephone Encounter (Signed)
Called and left detailed message for nurse at Dr. Ammie Ferrier office to return our call regarding cardiac clearance for patient's procedure

## 2021-04-23 NOTE — Telephone Encounter (Signed)
I have spoken to Dr Sanjuan Dame office. The nurse has informed me they have talked to patient and given instructions on holding Coumadin and the bridge for Lovenox is in place. I have not been able to reach the patient to verify this. Asked Dr Sanjuan Dame office to please fax the clearance instructions to our office for clarification and documentation

## 2021-04-24 ENCOUNTER — Encounter: Payer: Self-pay | Admitting: Internal Medicine

## 2021-04-24 NOTE — Progress Notes (Signed)
Received records about the patient's prior scopes  Colonoscopy 03/11/10: No polyps. Some diverticulosis. Minimal hemorrhoids. Recommended 10 year follow up.  EGD 10/10/07: 3 cm hiatal hernia. Biopsies of mid and distal esophagus showed mild chronic inflammation of the esophagus.  Colonoscopy 12/05/03: Mild internal hemorrhoids, mild sigmoid diverticulosis.

## 2021-04-24 NOTE — Telephone Encounter (Signed)
Patients wife has communicated with me and she states that hey did get instructions from cardiology with the Lovenox bridge

## 2021-04-29 ENCOUNTER — Other Ambulatory Visit: Payer: Self-pay

## 2021-04-29 ENCOUNTER — Encounter: Payer: Self-pay | Admitting: Internal Medicine

## 2021-04-29 ENCOUNTER — Ambulatory Visit (AMBULATORY_SURGERY_CENTER): Payer: Medicare Other | Admitting: Internal Medicine

## 2021-04-29 VITALS — BP 155/65 | HR 59 | Temp 98.6°F | Resp 19 | Ht 69.0 in | Wt 152.0 lb

## 2021-04-29 DIAGNOSIS — K5730 Diverticulosis of large intestine without perforation or abscess without bleeding: Secondary | ICD-10-CM

## 2021-04-29 DIAGNOSIS — D123 Benign neoplasm of transverse colon: Secondary | ICD-10-CM

## 2021-04-29 DIAGNOSIS — L080 Pyoderma: Secondary | ICD-10-CM | POA: Diagnosis not present

## 2021-04-29 DIAGNOSIS — D124 Benign neoplasm of descending colon: Secondary | ICD-10-CM | POA: Diagnosis not present

## 2021-04-29 DIAGNOSIS — K649 Unspecified hemorrhoids: Secondary | ICD-10-CM

## 2021-04-29 DIAGNOSIS — R195 Other fecal abnormalities: Secondary | ICD-10-CM | POA: Diagnosis not present

## 2021-04-29 DIAGNOSIS — D120 Benign neoplasm of cecum: Secondary | ICD-10-CM | POA: Diagnosis not present

## 2021-04-29 HISTORY — PX: COLONOSCOPY: SHX174

## 2021-04-29 MED ORDER — SODIUM CHLORIDE 0.9 % IV SOLN: 500.0000 mL | Freq: Once | INTRAVENOUS | Status: AC

## 2021-04-29 NOTE — Progress Notes (Signed)
To PACU, VSS. Report to [email or web address] 

## 2021-04-29 NOTE — Patient Instructions (Signed)
Information on polyps, diverticulosis and hemorrhoids given to you today.  Await pathology results.  Resume previous diet and medications.  Resume Coumadin at prior dose tomorrow.   YOU HAD AN ENDOSCOPIC PROCEDURE TODAY AT [facility]:   Refer to the procedure report that was given to you for any specific questions about what was found during the examination.  If the procedure report does not answer your questions, please call your gastroenterologist to clarify.  If you requested that your care partner not be given the details of your procedure findings, then the procedure report has been included in a sealed envelope for you to review at your convenience later.  YOU SHOULD EXPECT: Some feelings of bloating in the abdomen. Passage of more gas than usual.  Walking can help get rid of the air that was put into your GI tract during the procedure and reduce the bloating. If you had a lower endoscopy (such as a colonoscopy or flexible sigmoidoscopy) you may notice spotting of blood in your stool or on the toilet paper. If you underwent a bowel prep for your procedure, you may not have a normal bowel movement for a few days.  Please Note:  You might notice some irritation and congestion in your nose or some drainage.  This is from the oxygen used during your procedure.  There is no need for concern and it should clear up in a day or so.  SYMPTOMS TO REPORT IMMEDIATELY:  Following lower endoscopy (colonoscopy or flexible sigmoidoscopy):  Excessive amounts of blood in the stool  Significant tenderness or worsening of abdominal pains  Swelling of the abdomen that is new, acute  Fever of 100F or higher   For urgent or emergent issues, a gastroenterologist can be reached at any hour by calling [phone]. Do not use MyChart messaging for urgent concerns.    DIET:  We do recommend a small meal at first, but then you may proceed to your regular diet.  Drink plenty of fluids but  you should avoid alcoholic beverages for 24 hours.  ACTIVITY:  You should plan to take it easy for the rest of today and you should NOT DRIVE or use heavy machinery until tomorrow (because of the sedation medicines used during the test).    FOLLOW UP: Our staff will call the number listed on your records 48-72 hours following your procedure to check on you and address any questions or concerns that you may have regarding the information given to you following your procedure. If we do not reach you, we will leave a message.  We will attempt to reach you two times.  During this call, we will ask if you have developed any symptoms of COVID 19. If you develop any symptoms (ie: fever, flu-like symptoms, shortness of breath, cough etc.) before then, please call [phone].  If you test positive for Covid 19 in the 2 weeks post procedure, please call and report this information to [place].    If any biopsies were taken you will be contacted by phone or by letter within the next 1-3 weeks.  Please call us at [phone] if you have not heard about the biopsies in 3 weeks.    SIGNATURES/CONFIDENTIALITY: You and/or your care partner have signed paperwork which will be entered into your electronic medical record.  These signatures attest to the fact that that the information above on your After Visit Summary has been reviewed and is understood.  Full responsibility of the confidentiality  of this discharge information lies with you and/or your care-partner.  

## 2021-04-29 NOTE — Progress Notes (Signed)
GASTROENTEROLOGY PROCEDURE H&P NOTE   Primary Care Physician: Earney Mallet, MD    Reason for Procedure:   Positive Cologuard  Plan:    Colonoscopy  Patient is appropriate for endoscopic procedure(s) in the ambulatory ([place]) setting.  The nature of the procedure, as well as the risks, benefits, and alternatives were carefully and thoroughly reviewed with the patient. Ample time for discussion and questions allowed. The patient understood, was satisfied, and agreed to proceed.     HPI: Tyler Case is a 75 y.o. male who presents for colonoscopy for evaluation of positive Cologuard .  Patient was most recently seen in the Gastroenterology Clinic on 04/15/21.  No interval change in medical history since that appointment. Please refer to that note for full details regarding GI history and clinical presentation.   Colonoscopy 03/11/10: No polyps. Some diverticulosis. Minimal hemorrhoids. Recommended 10 year follow up.  Colonoscopy 12/05/03: Mild internal hemorrhoids, mild sigmoid diverticulosis.  Past Medical History:  Diagnosis Date   Amaurosis fugax, left eye    Anxiety due to invasive procedure    Arthritis    Bilateral carotid artery stenosis    Bilateral sacroiliitis (HCC)    BPH (benign prostatic hyperplasia)    Chronic daily headache    Chronic insomnia    Coronary artery disease    DDD (degenerative disc disease), lumbar    ED (erectile dysfunction)    Essential hypertension, benign    Gastroesophageal reflux disease    Generalized osteoarthrosis, involving multiple sites    Hx of CABG    Hypertension    Hypogonadism male    Idiopathic neuropathy    Idiopathic peripheral neuropathy    Iron deficiency    Long term (current) use of anticoagulants    Low back pain    Lumbar spondylosis    Mixed hyperlipidemia    MVP (mitral valve prolapse)    Rhinitis, allergic    S/P MVR (mitral valve replacement)    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 Authorizing Provider  alfuzosin (UROXATRAL) 10 MG 24 hr tablet Take 10 mg by mouth at bedtime.  12/27/19  Yes [provider]  amLODipine (NORVASC) 2.5 MG tablet Take 1 tablet (2.5 mg total) by mouth at bedtime. 03/15/20  Yes Daisy Floro, DO  aspirin EC 81 MG tablet Take 81 mg by mouth at bedtime.    Yes [provider]  Cholecalciferol 50 MCG (2000 UT) CAPS Take 2,000 Units by mouth daily.   Yes [provider]  gabapentin (NEURONTIN) 100 MG capsule Take 100 mg by mouth 2 (two) times daily.  02/16/20  Yes [provider]  GLUCOSAMINE-CHONDROITIN PO Take 1 tablet by mouth daily. Glucosamine-chondroitin 1500-1200   Yes [provider]  Multiple Vitamin (MULTIVITAMIN) tablet Take 1 tablet by mouth daily.   Yes [provider]  pantoprazole (PROTONIX) 40 MG tablet Take 40 mg by mouth daily. 11/30/19  Yes [provider]  simvastatin (ZOCOR) 40 MG tablet Take 40 mg by mouth at bedtime.    Yes [provider]  triamcinolone (NASACORT) 55 MCG/ACT AERO nasal inhaler Place 2 sprays into the nose daily as needed (allergies).   Yes [provider]  zolpidem (AMBIEN) 10 MG tablet Take 10 mg by mouth at bedtime as needed for sleep. 02/06/20  Yes [provider]  nitroGLYCERIN (NITROSTAT) 0.4 MG SL tablet Place 0.4 mg under the tongue every 5 (five) minutes x 3 doses as needed for chest pain. Patient not taking: Reported on 04/29/2021 01/29/20   [provider]  warfarin (COUMADIN)  4 MG tablet Take 2-4 mg by mouth at bedtime. Take 2mg  on Wed and Sat and 4mg  all other days. OR as directed 12/06/19   [provider]    Current Outpatient Medications  Medication Sig Dispense Refill   alfuzosin (UROXATRAL) 10 MG 24 hr tablet Take 10 mg by mouth at bedtime.      amLODipine (NORVASC) 2.5 MG tablet Take 1 tablet (2.5 mg total) by mouth at bedtime.     aspirin EC 81 MG tablet Take 81 mg by mouth at bedtime.      Cholecalciferol 50 MCG (2000 UT) CAPS Take 2,000 Units by mouth daily.     gabapentin (NEURONTIN) 100 MG capsule Take 100 mg by mouth 2 (two) times daily.      GLUCOSAMINE-CHONDROITIN PO Take 1 tablet by mouth daily. Glucosamine-chondroitin 1500-1200     Multiple Vitamin (MULTIVITAMIN) tablet Take 1 tablet by mouth daily.     pantoprazole (PROTONIX) 40 MG tablet Take 40 mg by mouth daily.     simvastatin (ZOCOR) 40 MG tablet Take 40 mg by mouth at bedtime.      triamcinolone (NASACORT) 55 MCG/ACT AERO nasal inhaler Place 2 sprays into the nose daily as needed (allergies).     zolpidem (AMBIEN) 10 MG tablet Take 10 mg by mouth at bedtime as needed for sleep.     nitroGLYCERIN (NITROSTAT) 0.4 MG SL tablet Place 0.4 mg under the tongue every 5 (five) minutes x 3 doses as needed for chest pain. (Patient not taking: Reported on 04/29/2021)     warfarin (COUMADIN) 4 MG tablet Take 2-4 mg by mouth at bedtime. Take 2mg  on Wed and Sat and 4mg  all other days. OR as directed     Current Facility-Administered Medications  Medication Dose Route Frequency Provider Last Rate Last Admin   0.9 %  sodium chloride infusion  500 mL Intravenous Once Sharyn Creamer, MD        Allergies as of 04/29/2021   (No Known Allergies)    Family History  Problem Relation Age of Onset   Hypertension Mother    Hypertension Father    Asthma Father     Social History   Socioeconomic History   Marital status: Married    Spouse name: Not on file   Number of children: Not on file    Years of education: Not on file   Highest education level: Not on file  Occupational History   Not on file  Tobacco Use   Smoking status: Never   Smokeless tobacco: Never  Vaping Use   Vaping Use: Never used  Substance and Sexual Activity   Alcohol use: Yes    Alcohol/week: 7.0 standard drinks    Types: 7 Glasses of wine per week   Drug use: Never   Sexual activity: Not on file  Other Topics Concern   Not  on file  Social History Narrative   Not on file   Social Determinants of Health   Financial Resource Strain: Not on file  Food Insecurity: Not on file  Transportation Needs: Not on file  Physical Activity: Not on file  Stress: Not on file  Social Connections: Not on file  Intimate Partner Violence: Not on file    Physical Exam: Vital signs in last 24 hours: BP (!) 166/87   Pulse 70   Temp 98.6 F (37 C)   Ht 5\' 9"  (1.753 m)   Wt 152 lb (68.9 kg)   SpO2 97%   BMI 22.45 kg/m  GEN: NAD EYE: Sclerae anicteric ENT: MMM CV: Non-tachycardic Pulm: CTA b/l GI: Soft NEURO:  Alert & Oriented   Christia Reading, MD [facility]   04/29/2021 10:22 AM

## 2021-04-29 NOTE — Op Note (Signed)
[place] Patient Name: Tyler Case Procedure Date: 04/29/2021 10:24 AM MRN: [id number] Endoscopist: Sonny Masters "Tyler Case ,  Age: 75 Referring MD:  Date of Birth: 1945/08/07 Gender: Male Account #: [id number] Procedure:                Colonoscopy Indications:              Positive Cologuard test Medicines:                Monitored Anesthesia Care Procedure:                Pre-Anesthesia Assessment:                           - Prior to the procedure, a History and Physical                            was performed, and patient medications and                            allergies were reviewed. The patient's tolerance of                            previous anesthesia was also reviewed. The risks                            and benefits of the procedure and the sedation                            options and risks were discussed with the patient.                            All questions were answered, and informed consent                            was obtained. Prior Anticoagulants: The patient has                            taken Coumadin (warfarin), last dose was 5 days                            prior to procedure. ASA Grade Assessment: III - A                            patient with severe systemic disease. After                            reviewing the risks and benefits, the patient was                            deemed in satisfactory condition to undergo the                            procedure.  After obtaining informed consent, the colonoscope                            was passed under direct vision. Throughout the                            procedure, the patient's blood pressure, pulse, and                            oxygen saturations were monitored continuously. The                            CF [id number] #[id number] was introduced through the anus                            and advanced to the the terminal ileum. The                             colonoscopy was performed without difficulty. The                            patient tolerated the procedure well. The quality                            of the bowel preparation was adequate. Scope In: 10:32:04 AM Scope Out: 11:24:44 AM Scope Withdrawal Time: 0 hours 16 minutes 12 seconds  Total Procedure Duration: 0 hours 52 minutes 40 seconds  Findings:                 The terminal ileum appeared normal.                           Seven sessile polyps were found in the descending                            colon, transverse colon and cecum. The polyps were                            2 to 8 mm in size. These polyps were removed with a                            cold snare. Resection and retrieval were complete.                           Multiple small-mouthed diverticula were found in                            the left colon.                           Non-bleeding internal hemorrhoids were found during                            retroflexion. The hemorrhoids were large. Complications:  No immediate complications. Estimated Blood Loss:     Estimated blood loss was minimal. Impression:               - The examined portion of the ileum was normal.                           - Seven 2 to 8 mm polyps in the descending colon,                            in the transverse colon and in the cecum, removed                            with a cold snare. Resected and retrieved.                           - Diverticulosis in the left colon.                           - Non-bleeding internal hemorrhoids. Recommendation:           - Discharge patient to home (with escort).                           - High fiber diet.                           - Await pathology results.                           - Resume Coumadin (warfarin) at prior dose tomorrow.                           - The findings and recommendations were discussed                            with the patient and/or patient's  family. Sonny Masters "Tyler Case,  04/29/2021 11:33:14 AM

## 2021-04-29 NOTE — Progress Notes (Signed)
Called to room to assist during endoscopic procedure.  Patient ID and intended procedure confirmed with present staff. Received instructions for my participation in the procedure from the performing physician.  

## 2021-05-01 ENCOUNTER — Telehealth: Payer: Self-pay | Admitting: *Deleted

## 2021-05-01 ENCOUNTER — Encounter: Payer: Self-pay | Admitting: Internal Medicine

## 2021-05-01 NOTE — Telephone Encounter (Signed)
Called for f/u phone call. Spoke with pt's wife. Pt was taken to the ED early this morning for significant bleeding post polypectomy. Wife states after CT scan they were told he was bleeding from two polypectomy sites. She states they have only seen the ED doctor and are waiting to see GI. States they went to [facility] ED and they transferred him via helicopter to another hospital in [place], [place] because [facility] was not able to accept transfers at that time. RN assured wife that Dr. Lorenso Courier would be made aware of pt's condition.

## 2021-05-01 NOTE — Telephone Encounter (Signed)
Called the patient's wife. He was transferred to [place], [place], and are currently admitted in the ICU in case his GI bleeding were to get worse. Per the wife's report, the patient is doing fine and bleeding appears to be improving. His Lovenox and warfarin are being held at this time, and plan is for him to get a colonoscopy procedure to stop the bleeding. They hope that if all goes well he will be able to leave the hospital tomorrow or the day afterwards. The patient and his wife followed the instructions from his cardiologist on how to manage his anticoagulation before and after the procedure, but they had a similar bleeding issue during a previous surgery. Patient's wife was appreciative of the phone call.

## 2021-07-02 ENCOUNTER — Other Ambulatory Visit: Payer: Self-pay

## 2021-07-02 ENCOUNTER — Encounter: Payer: Self-pay | Admitting: Dermatology

## 2021-07-02 ENCOUNTER — Ambulatory Visit (INDEPENDENT_AMBULATORY_CARE_PROVIDER_SITE_OTHER): Payer: Medicare Other | Admitting: Dermatology

## 2021-07-02 DIAGNOSIS — Z1283 Encounter for screening for malignant neoplasm of skin: Secondary | ICD-10-CM

## 2021-07-02 DIAGNOSIS — L821 Other seborrheic keratosis: Secondary | ICD-10-CM

## 2021-07-02 DIAGNOSIS — L570 Actinic keratosis: Secondary | ICD-10-CM | POA: Diagnosis not present

## 2021-07-02 DIAGNOSIS — D1800 Hemangioma unspecified site: Secondary | ICD-10-CM | POA: Diagnosis not present

## 2021-07-02 DIAGNOSIS — I872 Venous insufficiency (chronic) (peripheral): Secondary | ICD-10-CM | POA: Diagnosis not present

## 2021-07-02 DIAGNOSIS — L720 Epidermal cyst: Secondary | ICD-10-CM

## 2021-07-02 MED ORDER — TRIAMCINOLONE ACETONIDE 0.1 % EX CREA: TOPICAL_CREAM | CUTANEOUS | 3 refills | Status: AC

## 2021-07-02 MED ORDER — TOLAK 4 % EX CREA: TOPICAL_CREAM | CUTANEOUS | 3 refills | Status: AC

## 2021-07-21 ENCOUNTER — Encounter: Payer: Self-pay | Admitting: Dermatology

## 2021-07-21 NOTE — Progress Notes (Signed)
° °  Follow-Up Visit   Subjective  Tyler Case is a 75 y.o. male who presents for the following: Annual Exam (Patient here today for yearly skin check. Per patient he has several lesion on his face, scalp and tops of shoulders x years no bleeding, no pain. No family history of atypical moles, melanoma or non mole skin cancer. ).  Normal skin examination, multiple spots he would like examined Location:  Duration:  Quality:  Associated Signs/Symptoms: Modifying Factors:  Severity:  Timing: Context:   Objective  Well appearing patient in no apparent distress; mood and affect are within normal limits. Torso - Posterior (Back) Full body skin examination no atypical pigmented lesions or nonmelanoma skin cancer  Left Upper Back 5 mm flattopped brown textured papule  Left Upper Back Multiple 2 mm smooth red dermal papules  Mid Parietal Scalp Multiple inflamed flattened pink crusts, mostly UV damage but there may be a component of seborrheic dermatitis as well as  Left Lower Leg - Anterior, Right Lower Leg - Anterior Mild dermatitis, moderate hemosiderosis and edema.  Diminished pedal pulses.    A full examination was performed including scalp, head, eyes, ears, nose, lips, neck, chest, axillae, abdomen, back, buttocks, bilateral upper extremities, bilateral lower extremities, hands, feet, fingers, toes, fingernails, and toenails. All findings within normal limits unless otherwise noted below.  Areas beneath underwear not fully examined   Assessment & Plan    Encounter for screening for malignant neoplasm of skin Torso - Posterior (Back)  Annual skin examination, encouraged to self examine with spouse twice annually  Seborrheic keratosis Left Upper Back  Leave if stable  Hemangioma, unspecified site Left Upper Back  No intervention necessary  [place] Mid Back  AK (actinic keratosis) Mid Parietal Scalp  We will use topical triamcinolone until after the holidays and  then begin alternating triamcinolone and fluorouracil daily.  To contact me if there is too much irritation.  Fluorouracil (TOLAK) 4 % CREA - Mid Parietal Scalp Apply to scalp every other night 62-95 applications- avoid sunlight and expect irrigation  triamcinolone cream (KENALOG) 0.1 % - Mid Parietal Scalp Apply to affected area qd  Stasis dermatitis of both legs Left Lower Leg - Anterior; Right Lower Leg - Anterior  Discussed ways to minimize venous pressure.  May use triamcinolone on his legs on a as needed basis.  triamcinolone cream (KENALOG) 0.1 % - Left Lower Leg - Anterior, Right Lower Leg - Anterior Apply to affected area qd      I, Lavonna Monarch, MD, have reviewed all documentation for this visit.  The documentation on 07/21/21 for the exam, diagnosis, procedures, and orders are all accurate and complete.

## 2022-05-04 IMAGING — CT CT ANGIO NECK
2 of 7 series · 8 of 33 positions shown · IV contrast (OMNI 350)
Comparison: None.

CLINICAL DATA: Intermittent partial left vision loss



[Series 5: cta neck · axial · 0.40mm/px · z∈[-228,-102]mm · 2 of 189 slices shown]
[im 63/189  soft-tissue]
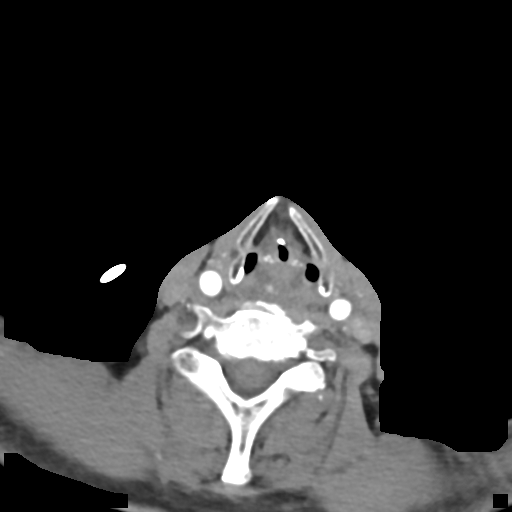
[im 126/189  soft-tissue]
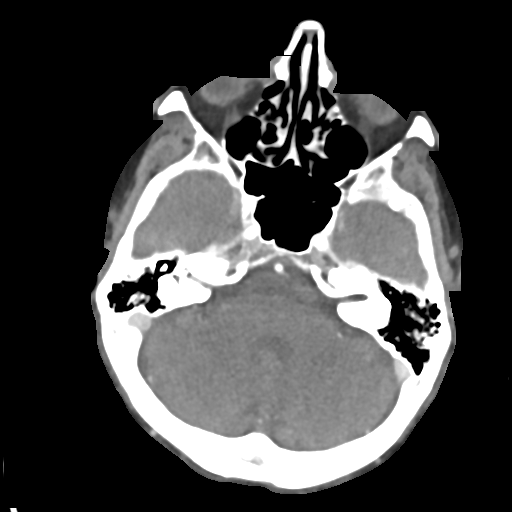

[Series 7: cta neck axial · axial · 0.39mm/px · z∈[-298,-30]mm · 6 of 376 slices shown]
[im 54/376  soft-tissue]
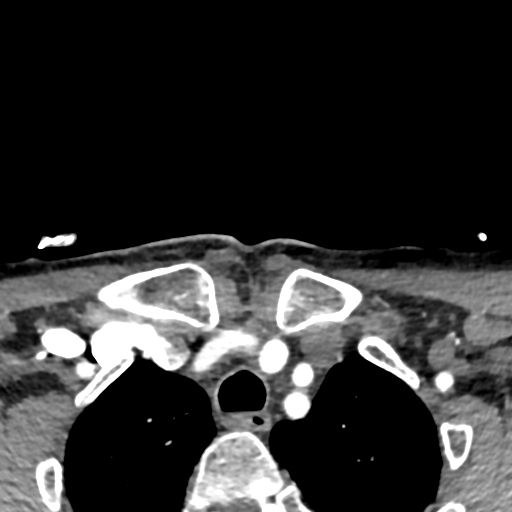
[im 108/376  bone]
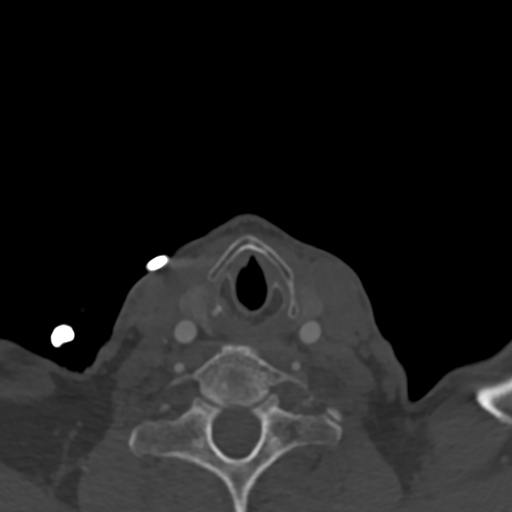
[im 161/376  soft-tissue]
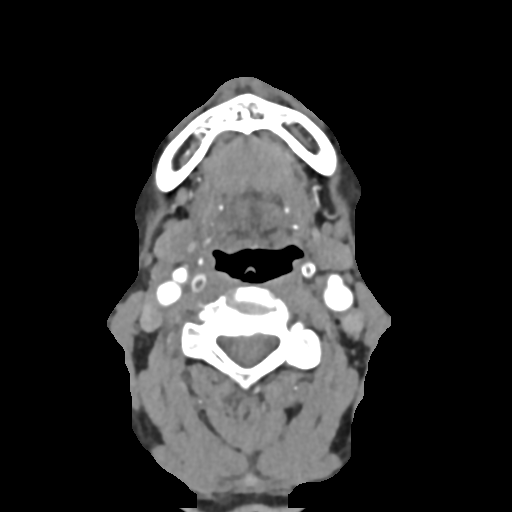
[im 215/376  bone]
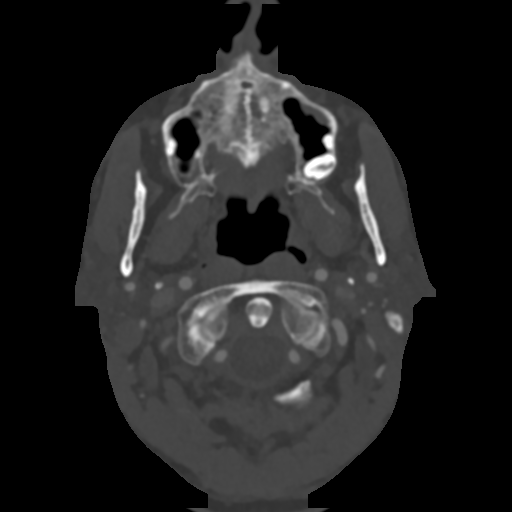
[im 268/376  soft-tissue]
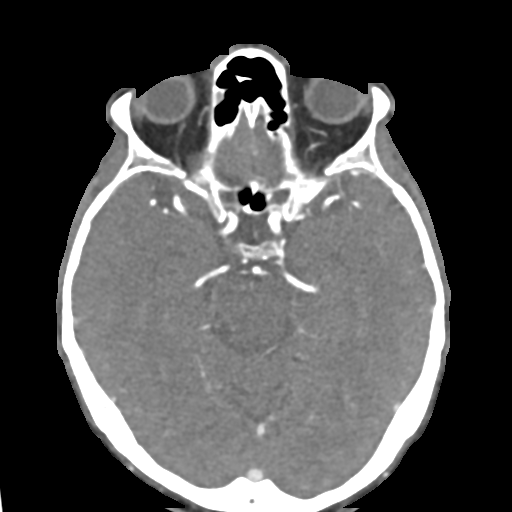
[im 322/376  bone]
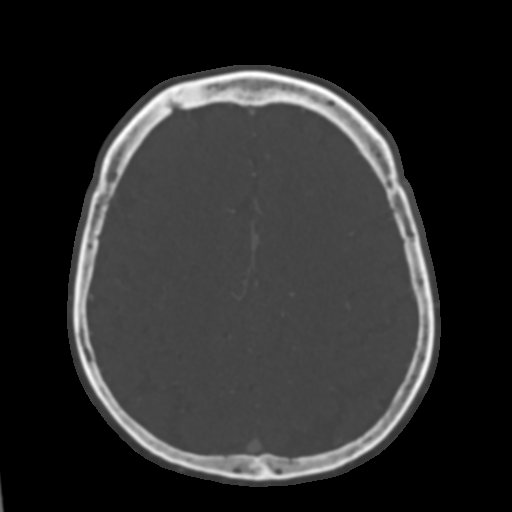

[8 of 33 positions shown; findings below may reference images not displayed]

FINDINGS: CTA NECK

Aortic arch: Great vessel origins are patent.

Right carotid system: Patent. There is no measurable stenosis at the
ICA origin. There is a broad-based medially directed outpouching
from the cervical ICA at the C2 level measuring approximately 6 x 5
mm on series 8, image 112.

Left carotid system: Patent. There is no measurable stenosis at the
ICA origin.

Vertebral arteries: Patent and codominant.

Skeleton: Degenerative changes of the cervical spine.

Other neck: No mass or adenopathy.

Upper chest: There is a 2 mm nodule of the right upper lobe on
series 5, image 166.

Review of the MIP images confirms the above findings

CTA HEAD

Anterior circulation: Intracranial internal carotid arteries patent
with trace calcified plaque. Anterior and middle cerebral arteries
are patent.

Posterior circulation: Intracranial vertebral arteries, basilar
artery and posterior cerebral arteries patent. There are patent
bilateral posterior communicating arteries.

Venous sinuses: Patent as allowed by contrast bolus timing.

Review of the MIP images confirms the above findings
IMPRESSION: No large vessel occlusion or hemodynamically significant stenosis.

Approximately 6 mm outpouching of the right cervical ICA at the C2
level likely reflecting a pseudoaneurysm.

2 mm right upper lobe nodule. No follow-up needed if patient is
low-risk. Non-contrast chest CT can be considered in 12 months if
patient is high-risk. This recommendation follows the consensus
statement: Guidelines for Management of Incidental Pulmonary Nodules
Detected on CT Images: From the [HOSPITAL] 3096; Radiology

## 2022-07-08 ENCOUNTER — Ambulatory Visit: Payer: Medicare Other | Admitting: Dermatology
